# Patient Record
Sex: Female | Born: 1955 | Race: White | Hispanic: No | Smoking: Never smoker
Health system: Southern US, Community
[De-identification: ages and names within clinical notes are randomized; demographics above are authoritative.]

## PROBLEM LIST (undated history)

## (undated) DIAGNOSIS — J302 Other seasonal allergic rhinitis: Secondary | ICD-10-CM

## (undated) DIAGNOSIS — F1410 Cocaine abuse, uncomplicated: Secondary | ICD-10-CM

## (undated) DIAGNOSIS — T8859XA Other complications of anesthesia, initial encounter: Secondary | ICD-10-CM

## (undated) DIAGNOSIS — C569 Malignant neoplasm of unspecified ovary: Secondary | ICD-10-CM

## (undated) DIAGNOSIS — J3489 Other specified disorders of nose and nasal sinuses: Secondary | ICD-10-CM

## (undated) DIAGNOSIS — F319 Bipolar disorder, unspecified: Secondary | ICD-10-CM

## (undated) DIAGNOSIS — R569 Unspecified convulsions: Secondary | ICD-10-CM

## (undated) HISTORY — PX: OOPHORECTOMY: SHX86

## (undated) HISTORY — DX: Cocaine abuse, uncomplicated: F14.10

## (undated) HISTORY — DX: Bipolar disorder, unspecified: F31.9

## (undated) HISTORY — PX: OTHER SURGICAL HISTORY: SHX169

## (undated) HISTORY — PX: TOTAL ABDOMINAL HYSTERECTOMY: SHX209

## (undated) HISTORY — DX: Other specified disorders of nose and nasal sinuses: J34.89

## (undated) HISTORY — PX: APPENDECTOMY: SHX54

## (undated) HISTORY — PX: BREAST BIOPSY: SHX20

---

## 1998-02-04 ENCOUNTER — Inpatient Hospital Stay (HOSPITAL_COMMUNITY): Admission: RE | Admit: 1998-02-04 | Discharge: 1998-02-07 | Payer: Self-pay | Admitting: *Deleted

## 1998-04-02 ENCOUNTER — Ambulatory Visit (HOSPITAL_COMMUNITY): Admission: RE | Admit: 1998-04-02 | Discharge: 1998-04-02 | Payer: Self-pay | Admitting: *Deleted

## 1998-04-20 ENCOUNTER — Inpatient Hospital Stay (HOSPITAL_COMMUNITY): Admission: AD | Admit: 1998-04-20 | Discharge: 1998-04-23 | Payer: Self-pay | Admitting: Obstetrics and Gynecology

## 1998-09-01 ENCOUNTER — Emergency Department (HOSPITAL_COMMUNITY): Admission: EM | Admit: 1998-09-01 | Discharge: 1998-09-01 | Payer: Self-pay | Admitting: Emergency Medicine

## 1998-09-01 ENCOUNTER — Encounter: Payer: Self-pay | Admitting: Emergency Medicine

## 1998-09-17 ENCOUNTER — Ambulatory Visit (HOSPITAL_COMMUNITY): Admission: RE | Admit: 1998-09-17 | Discharge: 1998-09-17 | Payer: Self-pay | Admitting: *Deleted

## 1998-11-17 ENCOUNTER — Emergency Department (HOSPITAL_COMMUNITY): Admission: EM | Admit: 1998-11-17 | Discharge: 1998-11-17 | Payer: Self-pay | Admitting: Emergency Medicine

## 1998-11-17 ENCOUNTER — Encounter: Payer: Self-pay | Admitting: Emergency Medicine

## 1999-06-09 ENCOUNTER — Other Ambulatory Visit: Admission: RE | Admit: 1999-06-09 | Discharge: 1999-06-09 | Payer: Self-pay | Admitting: *Deleted

## 1999-11-17 ENCOUNTER — Encounter: Admission: RE | Admit: 1999-11-17 | Discharge: 1999-11-17 | Payer: Self-pay | Admitting: Family Medicine

## 1999-11-17 ENCOUNTER — Encounter: Payer: Self-pay | Admitting: Family Medicine

## 2000-07-12 ENCOUNTER — Other Ambulatory Visit: Admission: RE | Admit: 2000-07-12 | Discharge: 2000-07-12 | Payer: Self-pay | Admitting: *Deleted

## 2001-10-26 ENCOUNTER — Ambulatory Visit (HOSPITAL_COMMUNITY): Admission: RE | Admit: 2001-10-26 | Discharge: 2001-10-26 | Payer: Self-pay | Admitting: Family Medicine

## 2001-10-26 ENCOUNTER — Other Ambulatory Visit: Admission: RE | Admit: 2001-10-26 | Discharge: 2001-10-26 | Payer: Self-pay | Admitting: *Deleted

## 2001-10-26 ENCOUNTER — Encounter: Payer: Self-pay | Admitting: Family Medicine

## 2001-12-28 ENCOUNTER — Ambulatory Visit (HOSPITAL_BASED_OUTPATIENT_CLINIC_OR_DEPARTMENT_OTHER): Admission: RE | Admit: 2001-12-28 | Discharge: 2001-12-28 | Payer: Self-pay | Admitting: Otolaryngology

## 2001-12-28 ENCOUNTER — Encounter (INDEPENDENT_AMBULATORY_CARE_PROVIDER_SITE_OTHER): Payer: Self-pay | Admitting: Specialist

## 2002-04-30 ENCOUNTER — Ambulatory Visit (HOSPITAL_COMMUNITY): Admission: RE | Admit: 2002-04-30 | Discharge: 2002-04-30 | Payer: Self-pay | Admitting: *Deleted

## 2002-04-30 ENCOUNTER — Encounter: Payer: Self-pay | Admitting: *Deleted

## 2002-08-19 ENCOUNTER — Encounter: Admission: RE | Admit: 2002-08-19 | Discharge: 2002-08-19 | Payer: Self-pay | Admitting: Family Medicine

## 2002-08-19 ENCOUNTER — Encounter: Payer: Self-pay | Admitting: Family Medicine

## 2002-11-18 ENCOUNTER — Other Ambulatory Visit: Admission: RE | Admit: 2002-11-18 | Discharge: 2002-11-18 | Payer: Self-pay | Admitting: *Deleted

## 2002-12-16 ENCOUNTER — Encounter: Admission: RE | Admit: 2002-12-16 | Discharge: 2002-12-16 | Payer: Self-pay | Admitting: *Deleted

## 2002-12-16 ENCOUNTER — Encounter: Payer: Self-pay | Admitting: *Deleted

## 2002-12-17 ENCOUNTER — Encounter: Payer: Self-pay | Admitting: *Deleted

## 2002-12-17 ENCOUNTER — Encounter: Admission: RE | Admit: 2002-12-17 | Discharge: 2002-12-17 | Payer: Self-pay | Admitting: *Deleted

## 2002-12-17 ENCOUNTER — Encounter (INDEPENDENT_AMBULATORY_CARE_PROVIDER_SITE_OTHER): Payer: Self-pay | Admitting: *Deleted

## 2003-01-06 ENCOUNTER — Other Ambulatory Visit: Admission: RE | Admit: 2003-01-06 | Discharge: 2003-01-06 | Payer: Self-pay | Admitting: Family Medicine

## 2003-10-19 ENCOUNTER — Ambulatory Visit (HOSPITAL_COMMUNITY): Admission: RE | Admit: 2003-10-19 | Discharge: 2003-10-19 | Payer: Self-pay | Admitting: Unknown Physician Specialty

## 2003-11-13 ENCOUNTER — Encounter: Admission: RE | Admit: 2003-11-13 | Discharge: 2003-11-13 | Payer: Self-pay | Admitting: Family Medicine

## 2003-11-20 ENCOUNTER — Other Ambulatory Visit: Admission: RE | Admit: 2003-11-20 | Discharge: 2003-11-20 | Payer: Self-pay | Admitting: *Deleted

## 2005-01-11 ENCOUNTER — Encounter: Admission: RE | Admit: 2005-01-11 | Discharge: 2005-01-11 | Payer: Self-pay | Admitting: Internal Medicine

## 2005-05-11 ENCOUNTER — Emergency Department (HOSPITAL_COMMUNITY): Admission: EM | Admit: 2005-05-11 | Discharge: 2005-05-12 | Payer: Self-pay | Admitting: Emergency Medicine

## 2005-11-30 ENCOUNTER — Encounter: Payer: Self-pay | Admitting: Obstetrics and Gynecology

## 2006-06-11 ENCOUNTER — Emergency Department (HOSPITAL_COMMUNITY): Admission: EM | Admit: 2006-06-11 | Discharge: 2006-06-12 | Payer: Self-pay | Admitting: Emergency Medicine

## 2006-08-04 ENCOUNTER — Emergency Department (HOSPITAL_COMMUNITY): Admission: EM | Admit: 2006-08-04 | Discharge: 2006-08-04 | Payer: Self-pay | Admitting: Emergency Medicine

## 2007-08-13 ENCOUNTER — Emergency Department (HOSPITAL_COMMUNITY): Admission: EM | Admit: 2007-08-13 | Discharge: 2007-08-13 | Payer: Self-pay | Admitting: Family Medicine

## 2007-09-24 ENCOUNTER — Encounter (INDEPENDENT_AMBULATORY_CARE_PROVIDER_SITE_OTHER): Payer: Self-pay | Admitting: Urology

## 2007-09-24 ENCOUNTER — Ambulatory Visit (HOSPITAL_BASED_OUTPATIENT_CLINIC_OR_DEPARTMENT_OTHER): Admission: RE | Admit: 2007-09-24 | Discharge: 2007-09-24 | Payer: Self-pay | Admitting: Urology

## 2008-07-16 ENCOUNTER — Encounter: Admission: RE | Admit: 2008-07-16 | Discharge: 2008-07-16 | Payer: Self-pay | Admitting: Radiology

## 2008-09-23 ENCOUNTER — Emergency Department (HOSPITAL_COMMUNITY): Admission: EM | Admit: 2008-09-23 | Discharge: 2008-09-23 | Payer: Self-pay | Admitting: Emergency Medicine

## 2008-10-02 ENCOUNTER — Emergency Department (HOSPITAL_COMMUNITY): Admission: EM | Admit: 2008-10-02 | Discharge: 2008-10-02 | Payer: Self-pay | Admitting: Family Medicine

## 2009-04-07 ENCOUNTER — Emergency Department (HOSPITAL_COMMUNITY): Admission: EM | Admit: 2009-04-07 | Discharge: 2009-04-07 | Payer: Self-pay | Admitting: Emergency Medicine

## 2009-06-02 ENCOUNTER — Emergency Department (HOSPITAL_COMMUNITY): Admission: EM | Admit: 2009-06-02 | Discharge: 2009-06-02 | Payer: Self-pay | Admitting: Family Medicine

## 2009-06-17 ENCOUNTER — Emergency Department (HOSPITAL_COMMUNITY): Admission: EM | Admit: 2009-06-17 | Discharge: 2009-06-17 | Payer: Self-pay | Admitting: Family Medicine

## 2009-06-18 ENCOUNTER — Inpatient Hospital Stay (HOSPITAL_COMMUNITY): Admission: EM | Admit: 2009-06-18 | Discharge: 2009-06-19 | Payer: Self-pay | Admitting: Emergency Medicine

## 2009-06-18 ENCOUNTER — Encounter (INDEPENDENT_AMBULATORY_CARE_PROVIDER_SITE_OTHER): Payer: Self-pay

## 2010-06-21 ENCOUNTER — Emergency Department (HOSPITAL_COMMUNITY): Admission: EM | Admit: 2010-06-21 | Discharge: 2010-06-21 | Payer: Self-pay | Admitting: Emergency Medicine

## 2010-10-11 ENCOUNTER — Inpatient Hospital Stay (INDEPENDENT_AMBULATORY_CARE_PROVIDER_SITE_OTHER)
Admission: RE | Admit: 2010-10-11 | Discharge: 2010-10-11 | Disposition: A | Discharge status: Home / Self Care | Payer: Self-pay | Source: Ambulatory Visit | Attending: Family Medicine | Admitting: Family Medicine

## 2010-10-11 DIAGNOSIS — J111 Influenza due to unidentified influenza virus with other respiratory manifestations: Secondary | ICD-10-CM

## 2010-10-14 ENCOUNTER — Emergency Department (HOSPITAL_COMMUNITY): Payer: Self-pay

## 2010-10-14 ENCOUNTER — Emergency Department (HOSPITAL_COMMUNITY)
Admission: EM | Admit: 2010-10-14 | Discharge: 2010-10-14 | Disposition: A | Discharge status: Home / Self Care | Payer: Self-pay | Attending: Emergency Medicine | Admitting: Emergency Medicine

## 2010-10-14 DIAGNOSIS — R05 Cough: Secondary | ICD-10-CM | POA: Insufficient documentation

## 2010-10-14 DIAGNOSIS — R059 Cough, unspecified: Secondary | ICD-10-CM | POA: Insufficient documentation

## 2010-10-14 DIAGNOSIS — IMO0001 Reserved for inherently not codable concepts without codable children: Secondary | ICD-10-CM | POA: Insufficient documentation

## 2010-10-14 DIAGNOSIS — J40 Bronchitis, not specified as acute or chronic: Secondary | ICD-10-CM | POA: Insufficient documentation

## 2010-11-03 ENCOUNTER — Emergency Department (HOSPITAL_COMMUNITY)
Admission: EM | Admit: 2010-11-03 | Discharge: 2010-11-03 | Disposition: A | Discharge status: Home / Self Care | Payer: Self-pay | Attending: Emergency Medicine | Admitting: Emergency Medicine

## 2010-11-03 DIAGNOSIS — H1045 Other chronic allergic conjunctivitis: Secondary | ICD-10-CM | POA: Insufficient documentation

## 2010-11-03 DIAGNOSIS — J9801 Acute bronchospasm: Secondary | ICD-10-CM | POA: Insufficient documentation

## 2010-11-03 DIAGNOSIS — R0602 Shortness of breath: Secondary | ICD-10-CM | POA: Insufficient documentation

## 2010-11-03 DIAGNOSIS — R0989 Other specified symptoms and signs involving the circulatory and respiratory systems: Secondary | ICD-10-CM | POA: Insufficient documentation

## 2010-11-03 DIAGNOSIS — R0609 Other forms of dyspnea: Secondary | ICD-10-CM | POA: Insufficient documentation

## 2010-11-03 LAB — DIFFERENTIAL
Basophils Absolute: 0 10*3/uL (ref 0.0–0.1)
Basophils Absolute: 0.2 10*3/uL — ABNORMAL HIGH (ref 0.0–0.1)
Basophils Relative: 0 % (ref 0–1)
Eosinophils Absolute: 0 10*3/uL (ref 0.0–0.7)
Eosinophils Absolute: 0.2 10*3/uL (ref 0.0–0.7)
Eosinophils Relative: 0 % (ref 0–5)
Lymphocytes Relative: 3 % — ABNORMAL LOW (ref 12–46)

## 2010-11-03 LAB — CBC
HCT: 33.6 % — ABNORMAL LOW (ref 36.0–46.0)
Hemoglobin: 11.4 g/dL — ABNORMAL LOW (ref 12.0–15.0)
MCHC: 33.8 g/dL (ref 30.0–36.0)
MCV: 87.8 fL (ref 78.0–100.0)
MCV: 89.3 fL (ref 78.0–100.0)
Platelets: 313 10*3/uL (ref 150–400)
RBC: 3.76 MIL/uL — ABNORMAL LOW (ref 3.87–5.11)
RBC: 4.57 MIL/uL (ref 3.87–5.11)
RDW: 12.7 % (ref 11.5–15.5)
WBC: 8.5 10*3/uL (ref 4.0–10.5)

## 2010-11-03 LAB — POCT URINALYSIS DIP (DEVICE)
Bilirubin Urine: NEGATIVE
Glucose, UA: NEGATIVE mg/dL

## 2010-11-03 LAB — COMPREHENSIVE METABOLIC PANEL
ALT: 18 U/L (ref 0–35)
AST: 23 U/L (ref 0–37)
Albumin: 4.1 g/dL (ref 3.5–5.2)
BUN: 10 mg/dL (ref 6–23)
CO2: 26 mEq/L (ref 19–32)
Creatinine, Ser: 0.87 mg/dL (ref 0.4–1.2)
Sodium: 134 mEq/L — ABNORMAL LOW (ref 135–145)

## 2010-11-03 LAB — RAPID URINE DRUG SCREEN, HOSP PERFORMED
Cocaine: POSITIVE — AB
Tetrahydrocannabinol: NOT DETECTED

## 2010-11-03 LAB — LIPASE, BLOOD: Lipase: 18 U/L (ref 11–59)

## 2010-12-14 NOTE — Op Note (Signed)
NAMEMAGGI, HERSHKOWITZ              ACCOUNT NO.:  [id number]   MEDICAL RECORD NO.:  [id number]          PATIENT TYPE:  AMB   LOCATION:  [facility]                         FACILITY:  [facility]   PHYSICIAN:  Maretta Bees. Vonita Moss, M.D.DATE OF BIRTH:  04-Feb-1956   DATE OF PROCEDURE:  09/24/2007  DATE OF DISCHARGE:                               OPERATIVE REPORT   PREOPERATIVE DIAGNOSIS:  Rule out interstitial cystitis.   POSTPROCEDURE DIAGNOSIS:  Rule out interstitial cystitis.   PROCEDURE:  Cystoscopy, hydraulic overdistention of bladder and cold cup  bladder biopsy.   SURGEON:  Dr. Larey Dresser.   ANESTHESIA:  General.   INDICATIONS:  This 55 year old lady has had problems with frequency and  nocturia and painful bladder and also some right lower quadrant and back  pain.  She was seen in the office and pelvic pain and symptom score was  significantly elevated to 28 and I am suspicious about interstitial  cystitis as the diagnosis.  I talked to the patient and her husband  about cystoscopy, HOD and bladder biopsy and the risk of postop pain and  bleeding and small risk of bladder perforation.   PROCEDURE:  The patient was brought to the operating room, placed in  lithotomy position.  External genitalia were prepped and draped in the  usual fashion.  She was cystoscoped and the bladder was perfectly  normal.  She held 550 mL and that was only because I had to compress the  urethra because she voided around the cystoscope.  Then after emptying  the bladder and looking back in, she had widespread submucosal petechiae  and hemorrhage consistent with interstitial cystitis.  Cold cup bladder  biopsies were taken from the bladder base and fulgurated with the Bugbee  electrode.  She then taken to recovery room in good condition after  receiving a B&O suppository and 30 mg of Toradol IV for postop  discomfort.      Maretta Bees. Vonita Moss, M.D.  Electronically Signed     LJP/MEDQ  D:  09/24/2007   T:  09/24/2007  Job:  [id number]   cc:   Lenon Curt. Chilton Si, M.D.  Fax: [phone]

## 2010-12-17 NOTE — Op Note (Signed)
[place]. [facility]  Patient:    Mary Mccormick, Mary Mccormick Visit Number: [id number] MRN: [id number]          Service Type: DSU Location: [facility] Attending Physician:  Carlean Purl Dictated by:   Kristine Garbe Ezzard Standing, M.D. Proc. Date: 12/28/01 Admit Date:  12/28/2001   CC:         Clydie Braun L. Hal Hope, M.D.   Operative Report  PREOPERATIVE DIAGNOSIS: 1. Enlarged right neck node. 2. 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 3. Chronic right ear and right neck pain.  POSTOPERATIVE DIAGNOSIS: 1. Enlarged right neck node. 2. 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 3. Chronic right ear and right neck pain.  OPERATION PERFORMED:  Direct laryngoscopy, excision of right tonsil.  Nasal endoscopy with biopsy.  Excision of deep right high jugular neck node.  SURGEON:  Kristine Garbe. Ezzard Standing, M.D.  ANESTHESIA:  General endotracheal.  COMPLICATIONS:  None.  INDICATIONS FOR PROCEDURE:  The patient is a 55 year old female who has had chronic right neck, right ear pain now for the past three months.  On examination, she has a normal upper airway exam.  Exam of the nose reveals a large anterior septal perforation with a large amount of crusting.  She also has a 2 to 3 cm painful high right jugular neck node just below the angle of the mandible.  She is taken to the operating room at this time for endoscopy with nasal biopsy, direct laryngoscopy and excision of right tonsil along with excision of deep right neck node.  DESCRIPTION OF PROCEDURE:  After adequate general endotracheal anesthesia, the nose was examined first.  The patient had a large anterior septal perforation with a large amount of crusting around the edges of the perforation as well as more posteriorly with more inflammation of the right side versus the left. Mucosal biopsies were obtained from the right nasal cavity and sent for frozen section  as well as permanent section.  On frozen section there is no evidence of neoplasia or active vasculitis.  Changes more consistent with chronic inflammation.  Following this, direct laryngoscopy was performed.  The base of tongue, vocal cords, aryepiglottic folds, pyriform sinuses were all clear. The patient had average sized tonsils.  The right tonsil was excised for biopsy.  Hemostasis was obtained with the cautery.  Following completion of this, the right neck was prepped with Betadine solution and draped out with sterile towels.  The proposed incision site of the right neck node was marked out and injected with Xylocaine with epinephrine.  An incision was then made through the skin down through the latissimus muscle and a high jugular neck node was dissected out.  This was smooth and rubbery, measuring approximately 1.5 x 3 cm in size.  Hemostasis was obtained with 3-0 silk ligatures and cautery.  The defect was closed with 3-0 chromic sutures subcutaneously and a 5-0 nylon subcuticular stitch.  Steri-Strips were placed as was a dressing. Following excision of the neck node, the nose was re-examined.  Hemostasis was obtained with cotton pledgets soaked in Afrin and suction cautery.  After obtaining adequate hemostasis, the nose was filled with Bactroban ointment. DISPOSITION:  The patient is discharged home later this morning on Tylenol and Lortab elixir 15 cc q.4h. p.r.n. pain, Augmentin suspension, 600 mg b.i.d. for a week.  Will have her follow-up in my office in one week for recheck and review pathology. Dictated by:   Kristine Garbe Ezzard Standing, M.D. Attending Physician:  Carlean Purl DD:  12/28/01 TD:  12/29/01 Job: [id number]

## 2011-04-21 LAB — I-STAT 8, (EC8 V) (CONVERTED LAB)
Acid-base deficit: 1
BUN: 7
Chloride: 109
HCT: 37
Hemoglobin: 12.6
Operator id: 151321
Potassium: 4
TCO2: 23
pCO2, Ven: 31.7 — ABNORMAL LOW
pH, Ven: 7.451 — ABNORMAL HIGH

## 2011-04-21 LAB — CBC
HCT: 37
Hemoglobin: 12.4
MCHC: 33.5
MCV: 88.1
Platelets: 271
RDW: 12.6
WBC: 5.3

## 2011-04-21 LAB — POCT I-STAT CREATININE
Creatinine, Ser: 0.7
Operator id: 151321

## 2011-04-21 LAB — POCT URINALYSIS DIP (DEVICE)
Ketones, ur: NEGATIVE
Nitrite: NEGATIVE
Operator id: 235561
pH: 5.5

## 2011-04-21 LAB — DIFFERENTIAL
Eosinophils Absolute: 0.8 — ABNORMAL HIGH
Eosinophils Relative: 16 — ABNORMAL HIGH
Lymphocytes Relative: 35
Monocytes Absolute: 0.6
Monocytes Relative: 11

## 2011-04-22 LAB — POCT HEMOGLOBIN-HEMACUE
Hemoglobin: 12.6
Operator id: 114531

## 2011-09-20 ENCOUNTER — Emergency Department (INDEPENDENT_AMBULATORY_CARE_PROVIDER_SITE_OTHER)
Admission: EM | Admit: 2011-09-20 | Discharge: 2011-09-20 | Disposition: A | Discharge status: Home / Self Care | Payer: Self-pay | Source: Home / Self Care | Attending: Family Medicine | Admitting: Family Medicine

## 2011-09-20 ENCOUNTER — Encounter (HOSPITAL_COMMUNITY): Payer: Self-pay | Admitting: Emergency Medicine

## 2011-09-20 DIAGNOSIS — J329 Chronic sinusitis, unspecified: Secondary | ICD-10-CM

## 2011-09-20 DIAGNOSIS — J45901 Unspecified asthma with (acute) exacerbation: Secondary | ICD-10-CM

## 2011-09-20 MED ORDER — IBUPROFEN 600 MG PO TABS: 600.0000 mg | ORAL_TABLET | Freq: Three times a day (TID) | ORAL | Status: AC | PRN

## 2011-09-20 MED ORDER — ALBUTEROL SULFATE HFA 108 (90 BASE) MCG/ACT IN AERS: 1.0000 | INHALATION_SPRAY | Freq: Four times a day (QID) | RESPIRATORY_TRACT | Status: DC | PRN

## 2011-09-20 MED ORDER — CETIRIZINE-PSEUDOEPHEDRINE ER 5-120 MG PO TB12: 1.0000 | ORAL_TABLET | Freq: Every day | ORAL | Status: AC

## 2011-09-20 MED ORDER — PREDNISONE 20 MG PO TABS: ORAL_TABLET | ORAL | Status: AC

## 2011-09-20 MED ORDER — AMOXICILLIN 500 MG PO CAPS: 500.0000 mg | ORAL_CAPSULE | Freq: Three times a day (TID) | ORAL | Status: AC

## 2011-09-20 NOTE — Discharge Instructions (Signed)
  Is very important top keep well hydrated. Take the prescribed medications as instructed. Can take ibuprofen scheduled for the next 24-48 hours take with food and plenty of liquids as it can upset your stomach, can alternate with tylenol every 6 hours as needed for pain or fever. Use nasal saline spray at least 3 times a day. (simply saline is over the counter) Return if difficulty breathing or not keeping fluids down.

## 2011-09-20 NOTE — ED Provider Notes (Signed)
History     CSN: [id number]  Arrival date & time 09/20/11  1826   First MD Initiated Contact with Patient 09/20/11 1846      Chief Complaint  Patient presents with  . Cough    (Consider location/radiation/quality/duration/timing/severity/associated sxs/prior treatment) HPI Comments: 56 year old former smoker female. Here complaining of nasal congestion, sinus pressure, headache, cough nonproductive and body aches for one week. Denies shortness of breath or chest pain. she states she has asthma and has used albuterol inhaler in the past but ran out. Has heard wheezing during coughing spells. Subjective fever. States she took ibuprofen about 6 hours ago her temperature here is 99.27F. had nausea and diarrhea the first days of the week, that is now resolved.   History reviewed. No pertinent past medical history.  History reviewed. No pertinent past surgical history.  No family history on file.  History  Substance Use Topics  . Smoking status: Former Games developer  . Smokeless tobacco: Not on file  . Alcohol Use: 0.6 oz/week    1 Shots of liquor per week     occasional    OB History    Grav Para Term Preterm Abortions TAB SAB Ect Mult Living                  Review of Systems  Constitutional: Positive for fever. Negative for chills.  HENT: Positive for congestion, rhinorrhea and sinus pressure. Negative for ear pain, sore throat and neck pain.   Respiratory: Positive for cough and wheezing. Negative for chest tightness.   Cardiovascular: Negative for chest pain and leg swelling.  Gastrointestinal: Negative for nausea, vomiting, abdominal pain and diarrhea.  Musculoskeletal: Positive for myalgias.  Skin: Negative for rash.  Neurological: Positive for headaches.    Allergies  Tagamet  Home Medications   Current Outpatient Rx  Name Route Sig Dispense Refill  . ALBUTEROL SULFATE HFA 108 (90 BASE) MCG/ACT IN AERS Inhalation Inhale 1-2 puffs into the lungs every 6 (six) hours  as needed for wheezing. 1 Inhaler 0  . AMOXICILLIN 500 MG PO CAPS Oral Take 1 capsule (500 mg total) by mouth 3 (three) times daily. 21 capsule 0  . CETIRIZINE-PSEUDOEPHEDRINE ER 5-120 MG PO TB12 Oral Take 1 tablet by mouth daily. 30 tablet 0  . IBUPROFEN 600 MG PO TABS Oral Take 1 tablet (600 mg total) by mouth every 8 (eight) hours as needed for pain. 20 tablet 0  . PREDNISONE 20 MG PO TABS  2 tabs po daqily for 5 days 10 tablet no    BP 131/69  Pulse 80  Temp(Src) 99.3 F (37.4 C) (Oral)  Resp 18  SpO2 100%  Physical Exam  Nursing note and vitals reviewed. Constitutional: She is oriented to person, place, and time. She appears well-developed and well-nourished. No distress.  HENT:  Head: Normocephalic and atraumatic.       Nasal Congestion with erythema and swelling of nasal turbinates, yellow rhinorrhea. mild pharyngeal erythema no exudates. No uvula deviation. No trismus. TM's normal  Eyes: Conjunctivae are normal. Pupils are equal, round, and reactive to light. Right eye exhibits no discharge. Left eye exhibits no discharge.  Neck: Neck supple.  Cardiovascular: Normal heart sounds.   Pulmonary/Chest: Breath sounds normal. No respiratory distress. She has no wheezes. She has no rales. She exhibits no tenderness.       Expiratory rhonchi during coughing. No tachypnea or orthopnea.   Lymphadenopathy:    She has no cervical adenopathy.  Neurological: She is alert  and oriented to person, place, and time.  Skin: No rash noted.    ED Course  Procedures (including critical care time)  Labs Reviewed - No data to display No results found.   1. Sinusitis   2. Asthma exacerbation       MDM  Treated with amoxicillin albuterol prednisone, ibuprofen and Zyrtec-D. asked her return if worsening symptoms despite following treatment.  Sharin Grave, MD 09/21/11 1012

## 2011-09-20 NOTE — ED Notes (Signed)
Pt. Stated, I started off having diarrhea, and then it was cold, cough, congestion, aand just pain all over. For a week

## 2011-11-19 ENCOUNTER — Emergency Department (INDEPENDENT_AMBULATORY_CARE_PROVIDER_SITE_OTHER)
Admission: EM | Admit: 2011-11-19 | Discharge: 2011-11-19 | Disposition: A | Discharge status: Home / Self Care | Payer: Self-pay | Source: Home / Self Care | Attending: Emergency Medicine | Admitting: Emergency Medicine

## 2011-11-19 ENCOUNTER — Encounter (HOSPITAL_COMMUNITY): Payer: Self-pay | Admitting: *Deleted

## 2011-11-19 DIAGNOSIS — R290 Tetany: Secondary | ICD-10-CM

## 2011-11-19 DIAGNOSIS — L259 Unspecified contact dermatitis, unspecified cause: Secondary | ICD-10-CM

## 2011-11-19 HISTORY — DX: Unspecified convulsions: R56.9

## 2011-11-19 HISTORY — DX: Other seasonal allergic rhinitis: J30.2

## 2011-11-19 LAB — POCT I-STAT, CHEM 8
Creatinine, Ser: 1.1 mg/dL (ref 0.50–1.10)
HCT: 43 % (ref 36.0–46.0)
Hemoglobin: 14.6 g/dL (ref 12.0–15.0)
Potassium: 4.3 mEq/L (ref 3.5–5.1)
Sodium: 141 mEq/L (ref 135–145)
TCO2: 26 mmol/L (ref 0–100)

## 2011-11-19 MED ORDER — METHYLPREDNISOLONE ACETATE 80 MG/ML IJ SUSP
INTRAMUSCULAR | Status: AC
  Filled 2011-11-19: qty 1

## 2011-11-19 MED ORDER — TRIAMCINOLONE ACETONIDE 0.1 % EX CREA: TOPICAL_CREAM | Freq: Three times a day (TID) | CUTANEOUS | Status: DC

## 2011-11-19 MED ORDER — METHYLPREDNISOLONE ACETATE 80 MG/ML IJ SUSP
80.0000 mg | Freq: Once | INTRAMUSCULAR | Status: AC
  Administered 2011-11-19: 80 mg via INTRAMUSCULAR

## 2011-11-19 MED ORDER — PREDNISONE 10 MG PO TABS: ORAL_TABLET | ORAL | Status: DC

## 2011-11-19 NOTE — ED Notes (Signed)
Rash right lower leg /bilateral arms - painful onset x one month - started on lower right leg then moved to arms and posterior neck at hairline

## 2011-11-19 NOTE — Discharge Instructions (Signed)

## 2011-11-19 NOTE — ED Provider Notes (Signed)
Chief Complaint  Patient presents with  . Rash    History of Present Illness:   The patient is a 56 year old female who has a one-month history of a rash on her arms, legs, neck, and scalp. The rash itches and hurts. Sometimes it feels like is "on fire". She recalls having had this before years ago. She saw a dermatologist who did a biopsy and told her was just a nonspecific dermatitis. She recalls that steroid creams did seem to help. She cannot think of any specific contactants. No change in soaps, detergents, washing powders, dryer sheets, or fabric softeners. She is not exposed to any network that might be breaking her out or any kind of home cleaning products or cosmetics. She is not taking any new medications or any suspicious foods.  Review of Systems:  Other than noted above, the patient denies any of the following symptoms: Systemic:  No fever, chills, sweats, weight loss, or fatigue. ENT:  No nasal congestion, rhinorrhea, sore throat, swelling of lips, tongue or throat. Resp:  No cough, wheezing, or shortness of breath. Skin:  No rash, itching, nodules, or suspicious lesions.  PMFSH:  Past medical history, family history, social history, meds, and allergies were reviewed.  Physical Exam:   Vital signs:  BP 117/71  Pulse 69  Temp(Src) 97.2 F (36.2 C) (Oral)  Resp 17  SpO2 95% Gen:  Alert, oriented, in no distress. Skin:  She has a nonspecific, erythematous, excoriated dermatitis on her left upper arm, left forearm, right elbow over the olecranon process, right lower leg, neck, and occipital scalp.  Course in Urgent Care Center:   She was given Depo-Medrol 80 mg IM and tolerated this well without any immediate side effects or complications.  Assessment:  The primary encounter diagnosis was Contact dermatitis. 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  Plan:   1.  The following meds were prescribed:   New Prescriptions   PREDNISONE (DELTASONE) 10 MG  TABLET    Take 4 tabs daily for 4 days, 3 tabs daily for 4 days, 2 tabs daily for 4 days, then 1 tab daily for 4 days.   TRIAMCINOLONE CREAM (KENALOG) 0.1 %    Apply topically 3 (three) times daily.   2.  The patient was instructed in symptomatic care and handouts were given. 3.  The patient was told to return if becoming worse in any way, if no better in 3 or 4 days, and given some red flag symptoms that would indicate earlier return.  Follow up:  The patient was told to follow up with [facility] if no better in 2 weeks.      Reuben Likes, MD 11/19/11 [phone]

## 2012-05-31 ENCOUNTER — Emergency Department (HOSPITAL_COMMUNITY)
Admission: EM | Admit: 2012-05-31 | Discharge: 2012-05-31 | Disposition: A | Discharge status: Home / Self Care | Payer: Self-pay | Attending: Emergency Medicine | Admitting: Emergency Medicine

## 2012-05-31 ENCOUNTER — Encounter (HOSPITAL_COMMUNITY): Payer: Self-pay | Admitting: Unknown Physician Specialty

## 2012-05-31 ENCOUNTER — Emergency Department (INDEPENDENT_AMBULATORY_CARE_PROVIDER_SITE_OTHER)
Admission: EM | Admit: 2012-05-31 | Discharge: 2012-05-31 | Disposition: A | Discharge status: Nursing Home (Includes ICF) | Payer: Self-pay | Source: Home / Self Care | Attending: Emergency Medicine | Admitting: Emergency Medicine

## 2012-05-31 ENCOUNTER — Encounter (HOSPITAL_COMMUNITY): Payer: Self-pay | Admitting: Emergency Medicine

## 2012-05-31 ENCOUNTER — Emergency Department (HOSPITAL_COMMUNITY): Payer: Self-pay

## 2012-05-31 DIAGNOSIS — J309 Allergic rhinitis, unspecified: Secondary | ICD-10-CM | POA: Insufficient documentation

## 2012-05-31 DIAGNOSIS — J40 Bronchitis, not specified as acute or chronic: Secondary | ICD-10-CM | POA: Insufficient documentation

## 2012-05-31 DIAGNOSIS — R0602 Shortness of breath: Secondary | ICD-10-CM | POA: Insufficient documentation

## 2012-05-31 DIAGNOSIS — J329 Chronic sinusitis, unspecified: Secondary | ICD-10-CM | POA: Insufficient documentation

## 2012-05-31 DIAGNOSIS — R079 Chest pain, unspecified: Secondary | ICD-10-CM

## 2012-05-31 DIAGNOSIS — Z79899 Other long term (current) drug therapy: Secondary | ICD-10-CM | POA: Insufficient documentation

## 2012-05-31 DIAGNOSIS — R569 Unspecified convulsions: Secondary | ICD-10-CM | POA: Insufficient documentation

## 2012-05-31 DIAGNOSIS — J45909 Unspecified asthma, uncomplicated: Secondary | ICD-10-CM | POA: Insufficient documentation

## 2012-05-31 MED ORDER — ASPIRIN 81 MG PO CHEW
324.0000 mg | CHEWABLE_TABLET | Freq: Once | ORAL | Status: AC
  Administered 2012-05-31: 324 mg via ORAL

## 2012-05-31 MED ORDER — NITROGLYCERIN 0.4 MG SL SUBL
0.4000 mg | SUBLINGUAL_TABLET | SUBLINGUAL | Status: AC | PRN
  Administered 2012-05-31: 0.4 mg via SUBLINGUAL

## 2012-05-31 MED ORDER — ALBUTEROL SULFATE (5 MG/ML) 0.5% IN NEBU
2.5000 mg | INHALATION_SOLUTION | Freq: Once | RESPIRATORY_TRACT | Status: AC
  Administered 2012-05-31: 2.5 mg via RESPIRATORY_TRACT
  Filled 2012-05-31: qty 0.5

## 2012-05-31 MED ORDER — AMOXICILLIN 500 MG PO CAPS: 500.0000 mg | ORAL_CAPSULE | Freq: Three times a day (TID) | ORAL | Status: DC

## 2012-05-31 MED ORDER — ALBUTEROL SULFATE HFA 108 (90 BASE) MCG/ACT IN AERS: 2.0000 | INHALATION_SPRAY | RESPIRATORY_TRACT | Status: DC | PRN

## 2012-05-31 MED ORDER — SODIUM CHLORIDE 0.9 % IV SOLN
INTRAVENOUS | Status: DC
Start: 2012-05-31 — End: 2012-05-31
  Administered 2012-05-31: 17:00:00 via INTRAVENOUS

## 2012-05-31 MED ORDER — PREDNISONE 20 MG PO TABS: 40.0000 mg | ORAL_TABLET | Freq: Every day | ORAL | Status: DC

## 2012-05-31 MED ORDER — NITROGLYCERIN 0.4 MG SL SUBL
SUBLINGUAL_TABLET | SUBLINGUAL | Status: AC
  Filled 2012-05-31: qty 25

## 2012-05-31 MED ORDER — ASPIRIN 81 MG PO CHEW
CHEWABLE_TABLET | ORAL | Status: AC
  Filled 2012-05-31: qty 4

## 2012-05-31 NOTE — ED Notes (Signed)
Reports intermittent episodes of bilateral arm pain onset last week, reports both hands "draw".

## 2012-05-31 NOTE — ED Notes (Signed)
Notified [facility] 

## 2012-05-31 NOTE — ED Provider Notes (Signed)
History     CSN: [id number]  Arrival date & time 05/31/12  1730   First MD Initiated Contact with Patient 05/31/12 1814      Chief Complaint  Patient presents with  . Chest Pain  . Shortness of Breath    (Consider location/radiation/quality/duration/timing/severity/associated sxs/prior treatment) HPI Comments: This is a 56 year old female, who presents to the emergency department with the chief complaint of cough and chest pain x3 weeks. The patient states that the cough and chest pain have not been improving. She has a family history of heart disease on her father's side, but denies diabetes, hypertension, hyperlipidemia, history of smoking, history of pulmonary embolism or blood clot, recent travel, or recent surgery. 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 She does not use a mask. She states that the pain in her chest is intermittent. She has tried using Mucinex with some relief.  The history is provided by the patient. No language interpreter was used.    Past Medical History  Diagnosis Date  . Asthma   . Seasonal allergies   . Seizure     Past Surgical History  Procedure Date  . Total abdominal hysterectomy w/ bilateral salpingoophorectomy   . Appendectomy     History reviewed. No pertinent family history.  History  Substance Use Topics  . Smoking status: Never Smoker   . Smokeless tobacco: Not on file  . Alcohol Use: 0.6 oz/week    1 Shots of liquor per week     occasional    OB History    Grav Para Term Preterm Abortions TAB SAB Ect Mult Living                  Review of Systems  Constitutional: Negative for fever.  Respiratory: Positive for cough, shortness of breath and wheezing.   Cardiovascular: Positive for chest pain. Negative for palpitations and leg swelling.  All other systems reviewed and are negative.    Allergies  Codeine and Tagamet  Home Medications   Current Outpatient Rx  Name Route Sig  Dispense Refill  . CETIRIZINE-PSEUDOEPHEDRINE ER 5-120 MG PO TB12 Oral Take 1 tablet by mouth daily. 30 tablet 0  . ALBUTEROL SULFATE HFA 108 (90 BASE) MCG/ACT IN AERS Inhalation Inhale 1-2 puffs into the lungs every 6 (six) hours as needed for wheezing. 1 Inhaler 0    BP 138/71  Pulse 63  Resp 14  SpO2 100%  Physical Exam  Nursing note and vitals reviewed. Constitutional: She is oriented to person, place, and time. She appears well-developed and well-nourished.  HENT:  Head: Normocephalic and atraumatic.  Eyes: Conjunctivae normal and EOM are normal. Pupils are equal, round, and reactive to light.  Neck: Normal range of motion. Neck supple.  Cardiovascular: Normal rate, regular rhythm and normal heart sounds.  Exam reveals no gallop and no friction rub.   No murmur heard. Pulmonary/Chest: Effort normal. No respiratory distress. She has wheezes. She has no rales. She exhibits no tenderness.  Abdominal: Soft. Bowel sounds are normal. She exhibits no distension and no mass. There is no tenderness. There is no rebound and no guarding.  Musculoskeletal: Normal range of motion.  Neurological: She is alert and oriented to person, place, and time.  Skin: Skin is warm and dry.  Psychiatric: She has a normal mood and affect. Her behavior is normal. Judgment and thought content normal.    ED Course  Procedures (including critical care time)  Labs  Reviewed - No data to display Dg Chest 2 View  05/31/2012  *RADIOLOGY REPORT*  Clinical Data: Shortness of breath.  Chest pain and cough.  CHEST - 2 VIEW  Comparison: Two-view chest 10/14/1998 at 12.  Findings: Heart size is normal.  Perihilar bronchitic changes appear chronic.  There may be some superimposed acute bronchitic change.  No airspace consolidation is evident.  The visualized soft tissues and bony thorax are unremarkable.  IMPRESSION:  1.  Bilateral perihilar bronchitic change.  There may be an acute component. 2.  No airspace  consolidation.   Original Report Authenticated By: Marin Roberts, M.D.    ED ECG REPORT  I personally interpreted this EKG   Date: 05/31/2012   Rate: 60  Rhythm: normal sinus rhythm  QRS Axis: normal  Intervals: normal  ST/T Wave abnormalities: nonspecific ST changes and nonspecific ST/T changes  Conduction Disutrbances:none  Narrative Interpretation:   Old EKG Reviewed: none available    1. Bronchitis   2. Sinusitis       MDM  56 year old female with chest pain and cough. I suspect that this is likely a bronchitis. This patient has been seen by and discussed with Dr. Bebe Shaggy.  I have also reviewed the note from [facility] tonight.  I have discussed with the patient that we are not able to completely rule out cardiac etiology with just the chest xray and the EKG.  She acknowledges this, and states that she is aware of the risks.  Return precautions have been given.  I am going to discharge the patient to home with instructions to follow-up with her PCP.  Treatments:  1. Amoxicillin 2. MDI 3. Prednisone         [name], PA-C 05/31/12 2002

## 2012-05-31 NOTE — ED Notes (Signed)
Mary Mccormick for 3 weeks: difficulty breathing, congested nose, reports low grade fever.  Left chest pain started last week, now pain is across chest, feels like someone is "squishing me together".  Denies nausea, denies vomiting.  Gets increase sob with activity.

## 2012-05-31 NOTE — ED Provider Notes (Signed)
Chief Complaint  Patient presents with  . Chest Pain    History of Present Illness:   The patient is a 56 year old female who presents with a three-week history of chest pain and tightness. The patient states that this began with some nasal congestion and drainage. She also had a slight cough and a slight sore throat. She denies any fever or chills. The chest tightness has been going on for about 2 weeks. It began on the left then moved to the right. It's a constant pressure or pain associated with some neck stiffness. Nothing particularly makes it better or worse. It's not worse with position, exertion, activity, meals. It is associated with some shortness of breath, nausea, sweats, palpitations, and dizziness. The patient also states she's felt tired and rundown, feverish, had chills, excessive gas. She also relates a separate history of aching in all the joints of both of her hands shocklike pain radiating to her hands and drawing of her hands for the past 3 weeks. She denies any history of arthritis or rheumatoid arthritis in the past. She has no risk factors including no hypertension, diabetes, elevated cholesterol, or history of cigarette smoking. Her father had a coronary artery bypass graft but this was in the 66s. She has no cardiac history, and otherwise is in good health. She is allergic to codeine and Tagamet.  Review of Systems:  Other than noted above, the patient denies any of the following symptoms. Systemic:  No fever, chills, sweats, or fatigue. ENT:  No nasal congestion, rhinorrhea, or sore throat. Pulmonary:  No cough, wheezing, shortness of breath, sputum production, hemoptysis. Cardiac:  No palpitations, rapid heartbeat, dizziness, presyncope or syncope. GI:  No abdominal pain, heartburn, nausea, or vomiting. Ext:  No leg pain or swelling.  PMFSH:  Past medical history, family history, social history, meds, and allergies were reviewed and updated as needed.   Physical Exam:     Vital signs:  BP 132/64  Pulse 67  Temp 97.9 F (36.6 C) (Oral)  Resp 12  SpO2 99% Gen:  Alert, oriented, in no distress, skin warm and dry. Eye:  PERRL, lids and conjunctivas normal.  Sclera non-icteric. ENT:  Mucous membranes moist, pharynx clear. Neck:  Supple, no adenopathy or tenderness.  No JVD. Lungs:  Clear to auscultation, no wheezes, rales or rhonchi.  No respiratory distress. Heart:  Regular rhythm.  No gallops, murmers, clicks or rubs. Chest:  No chest wall tenderness. Abdomen:  Soft, nontender, no organomegaly or mass.  Bowel sounds normal.  No pulsatile abdominal mass or bruit. Ext:  No edema.  No calf tenderness and Homann's sign negative.  Pulses full and equal. Skin:  Warm and dry.  No rash.  EKG:   Date: 05/31/2012  Rate: 60  Rhythm: normal sinus rhythm  QRS Axis: normal  Intervals: normal  ST/T Wave abnormalities: normal  Conduction Disutrbances:none  Narrative Interpretation: Normal sinus rhythm, possible left atrial enlargement, otherwise normal.  Old EKG Reviewed: none available  Medications given in [facility]:  She was given IV normal saline at 50 mL per hour, nitroglycerin 0.4 mg sublingually, and aspirin 325 mg by mouth.  Assessment:  The encounter diagnosis was Chest pain.  Plan:   1.  The following meds were prescribed:   New Prescriptions   No medications on file   2.  The patient was instructed in symptomatic care and handouts were given. 3.  The patient was transferred to the emergency department via [facility] in stable condition.  Reuben Likes, MD 05/31/12 [phone]

## 2012-05-31 NOTE — ED Notes (Signed)
Pt plac

## 2012-05-31 NOTE — ED Notes (Signed)
Placed pt on 2lpm 02.monitor @ 16:30

## 2012-05-31 NOTE — ED Notes (Signed)
Pt arrived via [facility] from [facility] with chest pain x3 wks. Patient states pain never goes completely away but fluctuates in intensity, 4/10 chest pressure at this time. Patient states she had neck pain but not at this time.

## 2012-06-01 NOTE — ED Provider Notes (Signed)
Medical screening examination/treatment/procedure(s) were conducted as a shared visit with non-physician practitioner(s) and myself.  I personally evaluated the patient during the encounter  Pt refused further workup except CXR/EKG.  She understood risks of leaving and encouraged to return if her symptoms worsened  Mary Gaskins, MD 06/01/12 1506

## 2013-06-21 ENCOUNTER — Emergency Department (INDEPENDENT_AMBULATORY_CARE_PROVIDER_SITE_OTHER)
Admission: EM | Admit: 2013-06-21 | Discharge: 2013-06-21 | Disposition: A | Discharge status: Home / Self Care | Payer: Self-pay | Source: Home / Self Care

## 2013-06-21 ENCOUNTER — Encounter (HOSPITAL_COMMUNITY): Payer: Self-pay | Admitting: Emergency Medicine

## 2013-06-21 DIAGNOSIS — J45909 Unspecified asthma, uncomplicated: Secondary | ICD-10-CM

## 2013-06-21 DIAGNOSIS — J329 Chronic sinusitis, unspecified: Secondary | ICD-10-CM

## 2013-06-21 DIAGNOSIS — B349 Viral infection, unspecified: Secondary | ICD-10-CM

## 2013-06-21 DIAGNOSIS — J309 Allergic rhinitis, unspecified: Secondary | ICD-10-CM

## 2013-06-21 DIAGNOSIS — F411 Generalized anxiety disorder: Secondary | ICD-10-CM

## 2013-06-21 DIAGNOSIS — R51 Headache: Secondary | ICD-10-CM

## 2013-06-21 DIAGNOSIS — R4589 Other symptoms and signs involving emotional state: Secondary | ICD-10-CM

## 2013-06-21 MED ORDER — ONDANSETRON HCL 4 MG/2ML IJ SOLN
INTRAMUSCULAR | Status: AC
  Filled 2013-06-21: qty 2

## 2013-06-21 MED ORDER — SODIUM CHLORIDE 0.9 % IV SOLN
Freq: Once | INTRAVENOUS | Status: AC
  Administered 2013-06-21: 15:00:00 via INTRAVENOUS

## 2013-06-21 MED ORDER — DIPHENOXYLATE-ATROPINE 2.5-0.025 MG/5ML PO LIQD: 10.0000 mL | Freq: Four times a day (QID) | ORAL | Status: DC | PRN

## 2013-06-21 MED ORDER — ONDANSETRON HCL 4 MG/2ML IJ SOLN
4.0000 mg | Freq: Once | INTRAMUSCULAR | Status: AC
  Administered 2013-06-21: 4 mg via INTRAVENOUS

## 2013-06-21 NOTE — ED Provider Notes (Signed)
CSN: [id number]     Arrival date & time 06/21/13  1244 History   First MD Initiated Contact with Patient 06/21/13 1414     Chief Complaint  Patient presents with  . URI    cough. congestion. fever. since sunday   (Consider location/radiation/quality/duration/timing/severity/associated sxs/prior Treatment) Patient is a 57 y.o. female presenting with URI. The history is provided by the patient.  URI Presenting symptoms: congestion, cough, ear pain, fever and rhinorrhea   Severity:  Mild Onset quality:  Gradual Duration:  4 days Progression:  Worsening Chronicity:  New Associated symptoms comment:  Diarrhea today, no blood seen., sl nausea, no vomiting.   Past Medical History  Diagnosis Date  . Asthma   . Seasonal allergies   . Seizure    Past Surgical History  Procedure Laterality Date  . Total abdominal hysterectomy w/ bilateral salpingoophorectomy    . Appendectomy     History reviewed. No pertinent family history. History  Substance Use Topics  . Smoking status: Never Smoker   . Smokeless tobacco: Not on file  . Alcohol Use: 0.6 oz/week    1 Shots of liquor per week     Comment: occasional   OB History   Grav Para Term Preterm Abortions TAB SAB Ect Mult Living                 Review of Systems  Constitutional: Positive for fever.  HENT: Positive for congestion, ear pain and rhinorrhea.   Respiratory: Positive for cough.   Cardiovascular: Negative.   Gastrointestinal: Negative.   Genitourinary: Negative.     Allergies  Codeine and Tagamet  Home Medications   Current Outpatient Rx  Name  Route  Sig  Dispense  Refill  . EXPIRED: albuterol (PROVENTIL HFA;VENTOLIN HFA) 108 (90 BASE) MCG/ACT inhaler   Inhalation   Inhale 1-2 puffs into the lungs every 6 (six) hours as needed for wheezing.   1 Inhaler   0   . albuterol (PROVENTIL HFA;VENTOLIN HFA) 108 (90 BASE) MCG/ACT inhaler   Inhalation   Inhale 2 puffs into the lungs every 4 (four) hours as needed  for wheezing or shortness of breath.   1 Inhaler   3   . amoxicillin (AMOXIL) 500 MG capsule   Oral   Take 1 capsule (500 mg total) by mouth 3 (three) times daily.   30 capsule   0   . diphenoxylate-atropine (LOMOTIL) 2.5-0.025 MG/5ML liquid   Oral   Take 10 mLs by mouth 4 (four) times daily as needed for diarrhea or loose stools.   120 mL   0   . predniSONE (DELTASONE) 20 MG tablet   Oral   Take 2 tablets (40 mg total) by mouth daily.   10 tablet   0    BP 115/74  Pulse 80  Temp(Src) 99 F (37.2 C) (Oral)  Resp 16  SpO2 99% Physical Exam  Nursing note and vitals reviewed. Constitutional: She appears well-developed and well-nourished.  Neck: Normal range of motion. Neck supple.  Cardiovascular: Regular rhythm and normal heart sounds.   Pulmonary/Chest: Effort normal and breath sounds normal.  Abdominal: Soft. Bowel sounds are normal. She exhibits no distension and no mass. There is no tenderness. There is no rebound and no guarding.  Skin: Skin is warm and dry.    ED Course  Procedures (including critical care time) Labs Review Labs Reviewed - No data to display Imaging Review No results found.  EKG Interpretation    Date/Time:  Ventricular Rate:    PR Interval:    QRS Duration:   QT Interval:    QTC Calculation:   R Axis:     Text Interpretation:              MDM  Sx improved after ivf.    Linna Hoff, MD 06/21/13 [phone]

## 2013-06-21 NOTE — ED Notes (Signed)
C/o nonproductive cough. Chest congestion with sob. Fever. And diarrhea that started today.  Pt has tried day quill with no relief in symptoms.  Hx of asthma

## 2013-09-17 ENCOUNTER — Encounter (HOSPITAL_COMMUNITY): Payer: Self-pay | Admitting: Emergency Medicine

## 2013-09-17 ENCOUNTER — Emergency Department (INDEPENDENT_AMBULATORY_CARE_PROVIDER_SITE_OTHER)
Admission: EM | Admit: 2013-09-17 | Discharge: 2013-09-17 | Disposition: A | Discharge status: Home / Self Care | Payer: Self-pay | Source: Home / Self Care | Attending: Emergency Medicine | Admitting: Emergency Medicine

## 2013-09-17 ENCOUNTER — Emergency Department (HOSPITAL_COMMUNITY)
Admission: EM | Admit: 2013-09-17 | Discharge: 2013-09-18 | Disposition: A | Discharge status: Home / Self Care | Payer: Self-pay | Attending: Emergency Medicine | Admitting: Emergency Medicine

## 2013-09-17 DIAGNOSIS — J309 Allergic rhinitis, unspecified: Secondary | ICD-10-CM | POA: Insufficient documentation

## 2013-09-17 DIAGNOSIS — R110 Nausea: Secondary | ICD-10-CM | POA: Insufficient documentation

## 2013-09-17 DIAGNOSIS — Z79899 Other long term (current) drug therapy: Secondary | ICD-10-CM | POA: Insufficient documentation

## 2013-09-17 DIAGNOSIS — R569 Unspecified convulsions: Secondary | ICD-10-CM | POA: Insufficient documentation

## 2013-09-17 DIAGNOSIS — R197 Diarrhea, unspecified: Secondary | ICD-10-CM

## 2013-09-17 DIAGNOSIS — R509 Fever, unspecified: Secondary | ICD-10-CM | POA: Insufficient documentation

## 2013-09-17 DIAGNOSIS — Z885 Allergy status to narcotic agent status: Secondary | ICD-10-CM | POA: Insufficient documentation

## 2013-09-17 DIAGNOSIS — J45909 Unspecified asthma, uncomplicated: Secondary | ICD-10-CM | POA: Insufficient documentation

## 2013-09-17 DIAGNOSIS — Z888 Allergy status to other drugs, medicaments and biological substances status: Secondary | ICD-10-CM | POA: Insufficient documentation

## 2013-09-17 DIAGNOSIS — K5792 Diverticulitis of intestine, part unspecified, without perforation or abscess without bleeding: Secondary | ICD-10-CM

## 2013-09-17 DIAGNOSIS — R1032 Left lower quadrant pain: Secondary | ICD-10-CM

## 2013-09-17 DIAGNOSIS — K5732 Diverticulitis of large intestine without perforation or abscess without bleeding: Secondary | ICD-10-CM

## 2013-09-17 LAB — URINALYSIS, ROUTINE W REFLEX MICROSCOPIC
Bilirubin Urine: NEGATIVE
Glucose, UA: NEGATIVE mg/dL
Ketones, ur: NEGATIVE mg/dL
Nitrite: NEGATIVE
PROTEIN: NEGATIVE mg/dL
Specific Gravity, Urine: 1.016 (ref 1.005–1.030)
Urobilinogen, UA: 0.2 mg/dL (ref 0.0–1.0)
pH: 5.5 (ref 5.0–8.0)

## 2013-09-17 LAB — CBC WITH DIFFERENTIAL/PLATELET
Basophils Absolute: 0 10*3/uL (ref 0.0–0.1)
Basophils Relative: 0 % (ref 0–1)
Eosinophils Absolute: 0.4 10*3/uL (ref 0.0–0.7)
Eosinophils Relative: 7 % — ABNORMAL HIGH (ref 0–5)
HCT: 39.4 % (ref 36.0–46.0)
Hemoglobin: 13.5 g/dL (ref 12.0–15.0)
LYMPHS PCT: 36 % (ref 12–46)
Lymphs Abs: 1.9 10*3/uL (ref 0.7–4.0)
MCH: 29.2 pg (ref 26.0–34.0)
MCHC: 34.3 g/dL (ref 30.0–36.0)
MCV: 85.3 fL (ref 78.0–100.0)
MONO ABS: 0.5 10*3/uL (ref 0.1–1.0)
Monocytes Relative: 10 % (ref 3–12)
NEUTROS ABS: 2.6 10*3/uL (ref 1.7–7.7)
Neutrophils Relative %: 48 % (ref 43–77)
Platelets: 276 10*3/uL (ref 150–400)
RBC: 4.62 MIL/uL (ref 3.87–5.11)
RDW: 12.7 % (ref 11.5–15.5)
WBC: 5.4 10*3/uL (ref 4.0–10.5)

## 2013-09-17 LAB — COMPREHENSIVE METABOLIC PANEL
ALT: 15 U/L (ref 0–35)
AST: 18 U/L (ref 0–37)
Albumin: 3.9 g/dL (ref 3.5–5.2)
Alkaline Phosphatase: 77 U/L (ref 39–117)
BILIRUBIN TOTAL: 0.4 mg/dL (ref 0.3–1.2)
BUN: 9 mg/dL (ref 6–23)
CHLORIDE: 102 meq/L (ref 96–112)
CO2: 24 meq/L (ref 19–32)
CREATININE: 0.89 mg/dL (ref 0.50–1.10)
Calcium: 9.3 mg/dL (ref 8.4–10.5)
GFR calc Af Amer: 82 mL/min — ABNORMAL LOW (ref >90)
GFR, EST NON AFRICAN AMERICAN: 71 mL/min — AB (ref >90)
Glucose, Bld: 104 mg/dL — ABNORMAL HIGH (ref 70–99)
Potassium: 3.8 mEq/L (ref 3.7–5.3)
SODIUM: 140 meq/L (ref 137–147)
Total Protein: 7 g/dL (ref 6.0–8.3)

## 2013-09-17 LAB — URINE MICROSCOPIC-ADD ON

## 2013-09-17 MED ORDER — MORPHINE SULFATE 4 MG/ML IJ SOLN: 4.0000 mg | Freq: Once | INTRAMUSCULAR | Status: DC

## 2013-09-17 MED ORDER — ONDANSETRON HCL 4 MG/2ML IJ SOLN: 4.0000 mg | Freq: Once | INTRAMUSCULAR | Status: DC

## 2013-09-17 MED ORDER — ONDANSETRON 4 MG PO TBDP
4.0000 mg | ORAL_TABLET | Freq: Once | ORAL | Status: AC
  Administered 2013-09-17: 4 mg via ORAL

## 2013-09-17 MED ORDER — SODIUM CHLORIDE 0.9 % IV BOLUS (SEPSIS)
1000.0000 mL | Freq: Once | INTRAVENOUS | Status: AC
  Administered 2013-09-17: 1000 mL via INTRAVENOUS

## 2013-09-17 MED ORDER — ONDANSETRON 4 MG PO TBDP
ORAL_TABLET | ORAL | Status: AC
  Filled 2013-09-17: qty 1

## 2013-09-17 MED ORDER — HYDROMORPHONE HCL PF 1 MG/ML IJ SOLN
1.0000 mg | Freq: Once | INTRAMUSCULAR | Status: AC
  Administered 2013-09-17: 1 mg via INTRAMUSCULAR

## 2013-09-17 MED ORDER — IOHEXOL 300 MG/ML  SOLN: 25.0000 mL | INTRAMUSCULAR | Status: AC

## 2013-09-17 MED ORDER — FENTANYL CITRATE 0.05 MG/ML IJ SOLN
50.0000 ug | Freq: Once | INTRAMUSCULAR | Status: AC
  Administered 2013-09-17: 50 ug via INTRAVENOUS
  Filled 2013-09-17: qty 2

## 2013-09-17 MED ORDER — ONDANSETRON HCL 4 MG/2ML IJ SOLN
4.0000 mg | Freq: Once | INTRAMUSCULAR | Status: AC
  Administered 2013-09-17: 4 mg via INTRAVENOUS
  Filled 2013-09-17: qty 2

## 2013-09-17 MED ORDER — HYDROMORPHONE HCL PF 1 MG/ML IJ SOLN
INTRAMUSCULAR | Status: AC
  Filled 2013-09-17: qty 1

## 2013-09-17 NOTE — ED Notes (Signed)
Pt c/o left flank pain onset yest Sxs also include: diarrhea, fevers Denies v/n, urinary sxs Alert w/no signs of acute distress.

## 2013-09-17 NOTE — ED Notes (Signed)
Pt ambulated to bathroom with assistance.

## 2013-09-17 NOTE — ED Provider Notes (Signed)
  Chief Complaint   Chief Complaint  Patient presents with  . Flank Pain    History of Present Illness   Mary Mccormick is a 58 year old female who's had a history since yesterday of localized pain in the left flank area. This is having pain and rated 10 over 10 at the most and now is an 8/10. Nothing makes it better or worse. It does not radiate. The patient has had anorexia, nausea, subjective fever, and chills. She's had several loose stools without blood or mucus. She denies any vomiting, urinary symptoms, or GYN complaints. She's never had anything like this before. She denies any history of diverticulosis, diverticulitis, colitis, kidney stones, or kidney infections.  Review of Systems   Other than as noted above, the patient denies any of the following symptoms: Constitutional:  No fever, chills, weight loss or anorexia. Lungs:  No cough or shortness of breath. Heart:  No chest pain, palpitations, syncope or edema.  Abdomen:  No nausea, vomiting, hematememesis, melena, diarrhea, or hematochezia. GU:  No dysuria, frequency, urgency, or hematuria. Gyn:  No vaginal discharge, itching, abnormal bleeding, dyspareunia, or pelvic pain.  PMFSH   Past medical history, family history, social history, meds, and allergies were reviewed. She is allergic to Tagamet. She has a history of asthma.  Physical Exam     Vital signs:  BP 100/64  Pulse 94  Temp(Src) 99 F (37.2 C) (Oral)  Resp 18  SpO2 99% Gen:  Alert, oriented, in no distress. Lungs:  Breath sounds clear and equal bilaterally.  No wheezes, rales or rhonchi. Heart:  Regular rhythm.  No gallops or murmers.   Abdomen:  Soft, flat, nondistended. No organomegaly or mass. Bowel sounds are normally active. She has localized tenderness to palpation in the left flank area with guarding and rebound. Skin:  Clear, warm and dry.  No rash.  Course in Urgent Care Center   Given Zofran ODT 8 mg sublingually and Dilaudid 1 mg  IM.  Assessment   The encounter diagnosis was Diverticulitis.  Differential diagnosis includes diverticulitis, colitis, kidney stone, kidney infection, ovarian cyst, or viral gastroenteritis. Will need further evaluation including abdominal CT scan.  Plan     The patient was transferred to the ED via shuttle in stable condition.  Medical Decision Making   58 year old female presents with 2 day history of left flank pain, subjective fever, chills, nausea, and diarrhea.  On exam she is exquisitely tender in left flank area with guarding suggesting diverticulitis.  We have give Dilaudid 1 mg IM and Zofran 4 mg IM and will transfer by shuttle.     Reuben Likesavid C Zaydenn Balaguer, MD 09/17/13 2014

## 2013-09-17 NOTE — ED Notes (Signed)
Pt c/o diarrhea and nausea since yesterday, states 12 episodes of diarrhea. Points to upper left quadrant for area of tenderness.

## 2013-09-17 NOTE — Discharge Instructions (Signed)
We have determined that your problem requires further evaluation in the emergency department.  We will take care of your transport there.  Once at the emergency department, you will be evaluated by a provider and they will order whatever treatment or tests they deem necessary.  We cannot guarantee that they will do any specific test or do any specific treatment.  ° °

## 2013-09-17 NOTE — ED Notes (Addendum)
[facility] LLQ pain/flank pain, diarrhea, nausea. [facility]

## 2013-09-18 ENCOUNTER — Encounter (HOSPITAL_COMMUNITY): Payer: Self-pay | Admitting: Radiology

## 2013-09-18 ENCOUNTER — Emergency Department (HOSPITAL_COMMUNITY): Payer: Self-pay

## 2013-09-18 LAB — CG4 I-STAT (LACTIC ACID): LACTIC ACID, VENOUS: 1.37 mmol/L (ref 0.5–2.2)

## 2013-09-18 MED ORDER — IOHEXOL 300 MG/ML  SOLN
100.0000 mL | Freq: Once | INTRAMUSCULAR | Status: AC | PRN
  Administered 2013-09-18: 100 mL via INTRAVENOUS

## 2013-09-18 NOTE — ED Provider Notes (Signed)
Medical screening examination/treatment/procedure(s) were conducted as a shared visit with non-physician practitioner(s) or resident  and myself.  I personally evaluated the patient during the encounter and agree with the findings and plan unless otherwise indicated.    I have personally reviewed any xrays and/ or EKG's with the provider and I agree with interpretation.   See separate note.   Enid SkeensJoshua M Avamae Dehaan, MD 09/18/13 [phone]

## 2013-09-18 NOTE — ED Notes (Addendum)
Contrast drank. Told Lorie in CT

## 2013-09-18 NOTE — ED Notes (Signed)
CG-4 result shown to [employer]UM! BrandsJulio Mccormick-Resident

## 2013-09-18 NOTE — Discharge Instructions (Signed)
Have mammogram done in the next two months to clarify small nodule seen on CT scan.  If you were given medicines take as directed.  If you are on coumadin or contraceptives realize their levels and effectiveness is altered by many different medicines.  If you have any reaction (rash, tongues swelling, other) to the medicines stop taking and see a physician.   Please follow up as directed and return to the ER or see a physician for new or worsening symptoms.  Thank you.

## 2013-09-18 NOTE — ED Provider Notes (Signed)
CSN: [id number]     Arrival date & time 09/17/13  2024 History   First MD Initiated Contact with Patient 09/17/13 2056     Chief Complaint  Patient presents with  . Diarrhea  . Abdominal Pain    Patient is a 58 y.o. female presenting with abdominal pain. The history is provided by the patient.  Abdominal Pain Pain location:  LLQ Pain quality: aching, cramping and squeezing   Pain radiates to:  Does not radiate Pain severity:  Mild Onset quality:  Gradual Duration:  2 days Timing:  Constant Progression:  Unchanged Chronicity:  New Context: alcohol use, previous surgery (hysterectomy) and recent sexual activity   Context: not awakening from sleep, not diet changes, not eating, not laxative use, not medication withdrawal, not recent illness, not recent travel and not trauma   Relieved by:  None tried Worsened by:  Palpation Ineffective treatments:  None tried Associated symptoms: diarrhea, fever and nausea   Associated symptoms: no anorexia, no belching, no chest pain, no chills, no cough, no dysuria, no fatigue, no flatus, no hematemesis, no hematochezia, no hematuria, no shortness of breath, no sore throat, no vaginal bleeding, no vaginal discharge and no vomiting       Past Medical History  Diagnosis Date  . Asthma   . Seasonal allergies   . Seizure    Past Surgical History  Procedure Laterality Date  . Total abdominal hysterectomy w/ bilateral salpingoophorectomy    . Appendectomy     History reviewed. No pertinent family history. History  Substance Use Topics  . Smoking status: Never Smoker   . Smokeless tobacco: Not on file  . Alcohol Use: 0.6 oz/week    1 Shots of liquor per week     Comment: occasional   OB History   Grav Para Term Preterm Abortions TAB SAB Ect Mult Living                 Review of Systems  Constitutional: Positive for fever. Negative for chills, activity change, appetite change and fatigue.  HENT: Negative for congestion, ear pain,  rhinorrhea and sore throat.   Eyes: Negative for pain.  Respiratory: Negative for cough and shortness of breath.   Cardiovascular: Negative for chest pain and palpitations.  Gastrointestinal: Positive for nausea, abdominal pain and diarrhea. Negative for vomiting, hematochezia, anorexia, flatus and hematemesis.  Genitourinary: Negative for dysuria, hematuria, vaginal bleeding, vaginal discharge, difficulty urinating and pelvic pain.  Musculoskeletal: Negative for back pain and neck pain.  Skin: Negative for rash and wound.  Neurological: Negative for weakness and headaches.  Psychiatric/Behavioral: Negative for behavioral problems, confusion and agitation.      Allergies  Codeine and Tagamet  Home Medications   Current Outpatient Rx  Name  Route  Sig  Dispense  Refill  . albuterol (PROVENTIL HFA;VENTOLIN HFA) 108 (90 BASE) MCG/ACT inhaler   Inhalation   Inhale 2 puffs into the lungs every 4 (four) hours as needed for wheezing or shortness of breath.   1 Inhaler   3   . dimenhyDRINATE (DRAMAMINE) 50 MG tablet   Oral   Take 50 mg by mouth every 8 (eight) hours as needed for nausea.         Marland Kitchen. ibuprofen (ADVIL,MOTRIN) 200 MG tablet   Oral   Take 600 mg by mouth every 6 (six) hours as needed for moderate pain.         Marland Kitchen. loperamide (IMODIUM A-D) 2 MG tablet   Oral  Take 4 mg by mouth 4 (four) times daily as needed for diarrhea or loose stools.          BP 108/59  Pulse 68  Temp(Src) 98.3 F (36.8 C)  Resp 18  Ht 5\' 1"  (1.549 m)  Wt 139 lb (63.05 kg)  BMI 26.28 kg/m2  SpO2 99% Physical Exam  Constitutional: She is oriented to person, place, and time. She appears well-developed and well-nourished. No distress.  HENT:  Head: Normocephalic and atraumatic.  Nose: Nose normal.  Mouth/Throat: Oropharynx is clear and moist.  Eyes: EOM are normal. Pupils are equal, round, and reactive to light.  Neck: Normal range of motion. Neck supple. No tracheal deviation present.   Cardiovascular: Normal rate, regular rhythm, normal heart sounds and intact distal pulses.   Pulmonary/Chest: Effort normal and breath sounds normal. She has no rales.  Abdominal: Soft. Bowel sounds are normal. She exhibits no distension. There is tenderness ( LLQ). There is no rebound and no guarding.  Genitourinary: No vaginal discharge found.  Musculoskeletal: Normal range of motion. She exhibits no tenderness.  Neurological: She is alert and oriented to person, place, and time.  Skin: Skin is warm and dry. No rash noted.  Psychiatric: She has a normal mood and affect. Her behavior is normal.    ED Course  Procedures (including critical care time) Labs Review  Results for orders placed during the hospital encounter of 09/17/13  CBC WITH DIFFERENTIAL      Result Value Ref Range   WBC 5.4  4.0 - 10.5 K/uL   RBC 4.62  3.87 - 5.11 MIL/uL   Hemoglobin 13.5  12.0 - 15.0 g/dL   HCT 11.9  14.7 - 82.9 %   MCV 85.3  78.0 - 100.0 fL   MCH 29.2  26.0 - 34.0 pg   MCHC 34.3  30.0 - 36.0 g/dL   RDW 56.2  13.0 - 86.5 %   Platelets 276  150 - 400 K/uL   Neutrophils Relative % 48  43 - 77 %   Neutro Abs 2.6  1.7 - 7.7 K/uL   Lymphocytes Relative 36  12 - 46 %   Lymphs Abs 1.9  0.7 - 4.0 K/uL   Monocytes Relative 10  3 - 12 %   Monocytes Absolute 0.5  0.1 - 1.0 K/uL   Eosinophils Relative 7 (*) 0 - 5 %   Eosinophils Absolute 0.4  0.0 - 0.7 K/uL   Basophils Relative 0  0 - 1 %   Basophils Absolute 0.0  0.0 - 0.1 K/uL  COMPREHENSIVE METABOLIC PANEL      Result Value Ref Range   Sodium 140  137 - 147 mEq/L   Potassium 3.8  3.7 - 5.3 mEq/L   Chloride 102  96 - 112 mEq/L   CO2 24  19 - 32 mEq/L   Glucose, Bld 104 (*) 70 - 99 mg/dL   BUN 9  6 - 23 mg/dL   Creatinine, Ser 7.84  0.50 - 1.10 mg/dL   Calcium 9.3  8.4 - 69.6 mg/dL   Total Protein 7.0  6.0 - 8.3 g/dL   Albumin 3.9  3.5 - 5.2 g/dL   AST 18  0 - 37 U/L   ALT 15  0 - 35 U/L   Alkaline Phosphatase 77  39 - 117 U/L   Total  Bilirubin 0.4  0.3 - 1.2 mg/dL   GFR calc non Af Amer 71 (*) >90 mL/min   GFR calc  Af Amer 82 (*) >90 mL/min  URINALYSIS, ROUTINE W REFLEX MICROSCOPIC      Result Value Ref Range   Color, Urine YELLOW  YELLOW   APPearance CLEAR  CLEAR   Specific Gravity, Urine 1.016  1.005 - 1.030   pH 5.5  5.0 - 8.0   Glucose, UA NEGATIVE  NEGATIVE mg/dL   Hgb urine dipstick TRACE (*) NEGATIVE   Bilirubin Urine NEGATIVE  NEGATIVE   Ketones, ur NEGATIVE  NEGATIVE mg/dL   Protein, ur NEGATIVE  NEGATIVE mg/dL   Urobilinogen, UA 0.2  0.0 - 1.0 mg/dL   Nitrite NEGATIVE  NEGATIVE   Leukocytes, UA TRACE (*) NEGATIVE  URINE MICROSCOPIC-ADD ON      Result Value Ref Range   Squamous Epithelial / LPF RARE  RARE   WBC, UA 3-6  <3 WBC/hpf   RBC / HPF 0-2  <3 RBC/hpf   Bacteria, UA RARE  RARE   Urine-Other MUCOUS PRESENT    CG4 I-STAT (LACTIC ACID)      Result Value Ref Range   Lactic Acid, Venous 1.37  0.5 - 2.2 mmol/L    Imaging Review No results found.  EKG Interpretation   None       MDM   Final diagnoses:  LLQ abdominal pain    58 yo F in NAD AFVSS non toxic appearing. Patient is here for LLQ abd pain x 2 days. Seen at urgent care center with concern for diverticulitis. Doubt obstruction given diarrhea. Patient is s/p total hysterectomy. No vaginal discharge or pain. UA without UTI. CRT wnl. No CVA ttp.  Doubt obstructing renal stone. No recent abx use , no leukocytosis doubt c diff infection. Will obtain CT abd/Pelvis.  Patient up dated multiple times throughout ED course. Pain well controlled with fentanyl. Of note patient states her blood pressure runs on the lower side. 1 L NS given in the ED.   1:33 AM Care discussed with my attending Dr. Gardiner Rhyme , care transferred. At sign out awaiting results of CT scan. Family up dated multiple times during ED course.        Nadara Mustard, MD 09/18/13 [phone]

## 2013-09-18 NOTE — ED Provider Notes (Signed)
Medical screening examination/treatment/procedure(s) were conducted as a shared visit with non-physician practitioner(s) or resident  and myself.  I personally evaluated the patient during the encounter and agree with the findings and plan unless otherwise indicated.    I have personally reviewed any xrays and/ or EKG's with the provider and I agree with interpretation.   Recurrent diarrhea and LLQ tenderness, intermittent, fever at home, no recent abx.  Exam LLQ and central tender mild, soft abdo, no guarding.  No improvement in ED.  CT abdo for diverticulitis/ colitis.  Pain meds and fluids. Labs unremarkable. Labs Reviewed  CBC WITH DIFFERENTIAL - Abnormal; Notable for the following:    Eosinophils Relative 7 (*)    All other components within normal limits  COMPREHENSIVE METABOLIC PANEL - Abnormal; Notable for the following:    Glucose, Bld 104 (*)    GFR calc non Af Amer 71 (*)    GFR calc Af Amer 82 (*)    All other components within normal limits  URINALYSIS, ROUTINE W REFLEX MICROSCOPIC - Abnormal; Notable for the following:    Hgb urine dipstick TRACE (*)    Leukocytes, UA TRACE (*)    All other components within normal limits  CLOSTRIDIUM DIFFICILE BY PCR  URINE MICROSCOPIC-ADD ON   Ct Abdomen Pelvis W Contrast  09/18/2013   CLINICAL DATA:  Left lower quadrant pain with nausea, vomiting, diarrhea and fever. History of appendectomy.  EXAM: CT ABDOMEN AND PELVIS WITH CONTRAST  TECHNIQUE: Multidetector CT imaging of the abdomen and pelvis was performed using the standard protocol following bolus administration of intravenous contrast.  CONTRAST:  100mL OMNIPAQUE IOHEXOL 300 MG/ML  SOLN  COMPARISON:  CT ABD W/CM dated 06/18/2009  FINDINGS: Limited view of the lung bases demonstrate mild dependent atelectasis, and are otherwise clear. Included heart and pericardium are unremarkable. 6 mm nodule in right breast, axial 2/.  The liver, spleen, pancreas, gallbladder and adrenal glands are  unremarkable and unchanged.  Stomach, small and large bowel are normal in course and caliber without inflammatory changes. Mild sigmoid diverticulosis without superimposed acute diverticulitis. Status post appendectomy. Contrast has yet to reach the large bowel. No intraperitoneal free fluid, fluid collections nor free air.  The kidneys are unremarkable and unchanged. Delayed imaging demonstrates prompt symmetric excretion into the proximal urinary collecting system. Urinary bladder is partially distended and unremarkable. Status post hysterectomy. Aortoiliac vessels are normal course and caliber, unremarkable. The inferior vena cava is slightly flattened.  Soft tissues are nonsuspicious, a tiny umbilical hernia containing fat. Osseous structures are nonsuspicious.  IMPRESSION: No acute intra-abdominal or pelvic process.  Slightly flattened inferior vena cava, recommend correlation with volume status.  6 mm nodule in right breast may reflect a lymph node though, would be better characterized on mammogram/ ultrasound as clinically indicated.   Electronically Signed   By: Awilda Metroourtnay  Bloomer   On: 09/18/2013 01:36   Discussed fup for mammogram outpt.  LLQ abdominal pain, Diarrhea  Enid SkeensJoshua M Sumayyah Custodio, MD 09/18/13 [phone]

## 2013-09-23 ENCOUNTER — Other Ambulatory Visit (HOSPITAL_COMMUNITY): Payer: Self-pay | Admitting: *Deleted

## 2013-09-23 DIAGNOSIS — N6310 Unspecified lump in the right breast, unspecified quadrant: Secondary | ICD-10-CM

## 2013-09-24 ENCOUNTER — Ambulatory Visit (HOSPITAL_COMMUNITY): Payer: Self-pay

## 2013-10-01 ENCOUNTER — Ambulatory Visit (HOSPITAL_COMMUNITY)
Admission: RE | Admit: 2013-10-01 | Discharge: 2013-10-01 | Disposition: A | Discharge status: Home / Self Care | Payer: Self-pay | Source: Ambulatory Visit | Attending: Obstetrics and Gynecology | Admitting: Obstetrics and Gynecology

## 2013-10-01 ENCOUNTER — Encounter (HOSPITAL_COMMUNITY): Payer: Self-pay

## 2013-10-01 VITALS — BP 100/68 | Temp 97.7°F | Ht 61.0 in | Wt 144.4 lb

## 2013-10-01 DIAGNOSIS — Z1239 Encounter for other screening for malignant neoplasm of breast: Secondary | ICD-10-CM

## 2013-10-01 NOTE — Progress Notes (Signed)
[facility] 6 mm breast nodule seen on CT scan 09/18/2013.   Pap Smear:  Pap smear not completed today. Last Pap smear was in 2000 and normal per patient. Per patient has a history of multiple abnormal Pap smears with the last abnormal Pap smear in 1998 with conization for follow-up. Per patient all Pap smears were normal after the conization was completed. Patient has a history of a hysterectomy for pain in 2000. Patient does not need any further Pap smears per BCCCP and ACOG guidelines. No Pap smear results in EPIC.  Physical exam: Breasts Breasts symmetrical. No skin abnormalities bilateral breasts. No nipple retraction bilateral breasts. No nipple discharge left breast. Patient complained of milky right breast discharge when expressed. Unable to express discharge on exam to send sample to cytology. No lymphadenopathy. No lumps palpated bilateral breasts. Complaints of bilateral outer and center breast tenderness on exam. [facility] Appointment scheduled for Thursday, October 03, 2013 at 1015.   Pelvic/Bimanual No Pap smear completed today since patient has a history of a hysterectomy for benign reasons. Pap smear not indicated per [facility]CP guidelines.

## 2013-10-01 NOTE — Patient Instructions (Signed)
Taught [name] how to perform BSE and gave educational materials to take home. Patient did not need a Pap smear today due to last Pap smear due to a history of a hysterectomy for benign reasons. Let patient know that she does not need any further Pap smears due to her history of a hysterectomy for benign reasons. [facility]. Appointment scheduled for Thursday, October 03, 2013 at 1015. Patient aware of appointment and will be there. Mary Mccormick verbalized understanding.  Kazden Largo, Kathaleen Maserhristine Poll, RN 3:49 PM

## 2013-10-03 ENCOUNTER — Ambulatory Visit
Admission: RE | Admit: 2013-10-03 | Discharge: 2013-10-03 | Disposition: A | Discharge status: Home / Self Care | Payer: No Typology Code available for payment source | Source: Ambulatory Visit | Attending: Obstetrics and Gynecology | Admitting: Obstetrics and Gynecology

## 2013-10-03 DIAGNOSIS — N6310 Unspecified lump in the right breast, unspecified quadrant: Secondary | ICD-10-CM

## 2013-10-11 ENCOUNTER — Other Ambulatory Visit: Payer: Self-pay

## 2013-10-11 ENCOUNTER — Ambulatory Visit: Payer: Self-pay

## 2013-10-18 ENCOUNTER — Ambulatory Visit: Payer: Self-pay

## 2013-10-18 ENCOUNTER — Other Ambulatory Visit: Payer: Self-pay

## 2013-11-08 ENCOUNTER — Other Ambulatory Visit: Payer: Self-pay

## 2013-11-08 ENCOUNTER — Ambulatory Visit: Payer: Self-pay

## 2013-11-18 ENCOUNTER — Encounter (HOSPITAL_COMMUNITY): Payer: Self-pay | Admitting: Emergency Medicine

## 2013-11-18 ENCOUNTER — Emergency Department (INDEPENDENT_AMBULATORY_CARE_PROVIDER_SITE_OTHER)
Admission: EM | Admit: 2013-11-18 | Discharge: 2013-11-18 | Disposition: A | Discharge status: Home / Self Care | Payer: Self-pay | Source: Home / Self Care

## 2013-11-18 DIAGNOSIS — F419 Anxiety disorder, unspecified: Secondary | ICD-10-CM

## 2013-11-18 DIAGNOSIS — R51 Headache: Secondary | ICD-10-CM

## 2013-11-18 DIAGNOSIS — J309 Allergic rhinitis, unspecified: Secondary | ICD-10-CM

## 2013-11-18 DIAGNOSIS — J329 Chronic sinusitis, unspecified: Secondary | ICD-10-CM

## 2013-11-18 DIAGNOSIS — R519 Headache, unspecified: Secondary | ICD-10-CM

## 2013-11-18 DIAGNOSIS — R451 Restlessness and agitation: Secondary | ICD-10-CM

## 2013-11-18 DIAGNOSIS — J45909 Unspecified asthma, uncomplicated: Secondary | ICD-10-CM

## 2013-11-18 MED ORDER — DEXAMETHASONE SODIUM PHOSPHATE 10 MG/ML IJ SOLN
10.0000 mg | Freq: Once | INTRAMUSCULAR | Status: AC
  Administered 2013-11-18: 10 mg via INTRAMUSCULAR

## 2013-11-18 MED ORDER — LORAZEPAM 2 MG/ML IJ SOLN
INTRAMUSCULAR | Status: AC
  Filled 2013-11-18: qty 1

## 2013-11-18 MED ORDER — METHYLPREDNISOLONE 4 MG PO KIT: PACK | ORAL | Status: DC

## 2013-11-18 MED ORDER — CEFUROXIME AXETIL 250 MG PO TABS: 250.0000 mg | ORAL_TABLET | Freq: Two times a day (BID) | ORAL | Status: DC

## 2013-11-18 MED ORDER — KETOROLAC TROMETHAMINE 60 MG/2ML IM SOLN
60.0000 mg | Freq: Once | INTRAMUSCULAR | Status: AC
  Administered 2013-11-18: 60 mg via INTRAMUSCULAR

## 2013-11-18 MED ORDER — LORAZEPAM 2 MG/ML IJ SOLN
2.0000 mg | Freq: Once | INTRAMUSCULAR | Status: AC
  Administered 2013-11-18: 2 mg via INTRAMUSCULAR

## 2013-11-18 MED ORDER — DEXAMETHASONE SODIUM PHOSPHATE 10 MG/ML IJ SOLN
INTRAMUSCULAR | Status: AC
Start: 2013-11-18 — End: 2013-11-18
  Filled 2013-11-18: qty 1

## 2013-11-18 MED ORDER — KETOROLAC TROMETHAMINE 60 MG/2ML IM SOLN
INTRAMUSCULAR | Status: AC
  Filled 2013-11-18: qty 2

## 2013-11-18 MED ORDER — LORAZEPAM 2 MG/ML IJ SOLN: 1.0000 mg | Freq: Once | INTRAMUSCULAR | Status: DC

## 2013-11-18 NOTE — ED Provider Notes (Signed)
CSN: [id number]     Arrival date & time 11/18/13  1212 History   First MD Initiated Contact with Patient 11/18/13 1351     Chief Complaint  Patient presents with  . URI   (Consider location/radiation/quality/duration/timing/severity/associated sxs/prior Treatment) HPI Comments: 58-year-old female presents with a three-day history of headache, sinus congestion, cough, body aches and chest congestion and nasal congestion. Of these she states her headache is the worst. She denies documented fevers. She states she has a history of asthma and is having shortness of breath due to the inability to breathe through her nose. She has taken Mucinex and sinus pills without relief. Chief complaint concern is that of a headache is located in the left parietal head. She is highly agitated, nervous and anxious. She is kicking her legs while sitting on a table she is bouncing up and down on the table and moving her arms wildly and grabbing her head.   Past Medical History  Diagnosis Date  . Asthma   . Seasonal allergies   . Seizure    Past Surgical History  Procedure Laterality Date  . Total abdominal hysterectomy w/ bilateral salpingoophorectomy    . Appendectomy     Family History  Problem Relation Age of Onset  . Hypertension Mother   . Thyroid disease Mother   . Diabetes Father   . Hypertension Father   . Cancer Maternal Aunt     thyroid  . Cancer Maternal Uncle     thyroid  . Hypertension Paternal Uncle   . Cancer Maternal Grandfather     colon   History  Substance Use Topics  . Smoking status: Never Smoker   . Smokeless tobacco: Not on file  . Alcohol Use: 0.6 oz/week    1 Shots of liquor per week     Comment: occasional   OB History   Grav Para Term Preterm Abortions TAB SAB Ect Mult Living   3 2 2  1     2      Review of Systems  Constitutional: Positive for activity change. Negative for fever, chills, appetite change and fatigue.  HENT: Positive for congestion, postnasal  drip, rhinorrhea and sinus pressure. Negative for facial swelling, sore throat and trouble swallowing.   Eyes: Negative.   Respiratory: Positive for cough and shortness of breath.   Cardiovascular: Positive for chest pain.  Gastrointestinal: Negative.   Musculoskeletal: Negative for neck pain and neck stiffness.  Skin: Negative for pallor and rash.  Neurological: Positive for tremors and headaches. Negative for dizziness, seizures, syncope, speech difficulty, weakness and numbness.  Psychiatric/Behavioral: Positive for agitation. The patient is nervous/anxious.     Allergies  Codeine and Tagamet  Home Medications   Prior to Admission medications   Medication Sig Start Date End Date Taking? Authorizing Provider  albuterol (PROVENTIL HFA;VENTOLIN HFA) 108 (90 BASE) MCG/ACT inhaler Inhale 2 puffs into the lungs every 4 (four) hours as needed for wheezing or shortness of breath. 05/31/12   Roxy Horsemanobert Browning, PA-C  dimenhyDRINATE (DRAMAMINE) 50 MG tablet Take 50 mg by mouth every 8 (eight) hours as needed for nausea.    Historical Provider, MD  ibuprofen (ADVIL,MOTRIN) 200 MG tablet Take 600 mg by mouth every 6 (six) hours as needed for moderate pain.    Historical Provider, MD  loperamide (IMODIUM A-D) 2 MG tablet Take 4 mg by mouth 4 (four) times daily as needed for diarrhea or loose stools.    Historical Provider, MD   BP 127/70  Pulse  90  Temp(Src) 98 F (36.7 C) (Oral)  Resp 16  SpO2 98% Physical Exam  Nursing note and vitals reviewed. Constitutional: She is oriented to person, place, and time. She appears well-developed and well-nourished. She appears distressed.  HENT:  Mouth/Throat: No oropharyngeal exudate.  Bilat TM's retracted. OP with copious thick white PND, cobblestoning.  Eyes: Conjunctivae are normal.  Neck: Normal range of motion. Neck supple.  Cardiovascular: Normal rate, regular rhythm and normal heart sounds.   Pulmonary/Chest: Effort normal and breath sounds  normal. No respiratory distress. She has no wheezes. She has no rales.  Musculoskeletal: Normal range of motion. She exhibits no edema.  Lymphadenopathy:    She has no cervical adenopathy.  Neurological: She is alert and oriented to person, place, and time.  Skin: Skin is warm and dry. No rash noted.  Psychiatric: Her mood appears anxious. Her affect is blunt. She is agitated.  Profound psychomotor agitation.    ED Course  Procedures (including critical care time) Labs Review Labs Reviewed - No data to display  Imaging Review No results found.   MDM   1. Sinusitis   2. Allergic rhinitis   3. Sinus headache   4. Asthma   5. Psychomotor agitation   6. Anxiety      30 min post injection of ativan 2 mg, Toradol 60 mg and decadron 10 mg pt is feeling a little better.  Will tx with  Ceftin, medrol dose pack, OTC sinus meds, nasal sprays. Use albuterol HFA as needed. Lungs clear today. D/C in stable and improved   Hayden RasmussenDavid Mayley Lish, NP 11/18/13 1512  Hayden Rasmussenavid Sarely Stracener, NP 11/18/13 1513

## 2013-11-18 NOTE — ED Notes (Signed)
PT    REPORTS  SYMPTOMS  OF   SORETHROAT  BODY  ACHES   AS  WELL  AS   A  NON  PRODUCTIVE  COUGH           WITH  THE  SYMPTOMS  X  3  DAYS       -  PT  AMBULATED  TO  ROOM  WITH A  STEADY  FLUID  GAIT            PT  REPORTS  SYMPTOMS  NOT  RELEIVED  BY  OTC  MED[facility]

## 2013-11-18 NOTE — Discharge Instructions (Signed)
Allergic Rhinitis allegra 180 mg a day Flonase nasal spray Saline nasal spray Sudafed PE 10 mg every 4 hours for congestion Asthma, Adult Asthma is a condition of the lungs in which the airways tighten and narrow. Asthma can make it hard to breathe. Asthma cannot be cured, but medicine and lifestyle changes can help control it. Asthma may be started (triggered) by:  Animal skin flakes (dander).  Dust.  Cockroaches.  Pollen.  Mold.  Smoke.  Cleaning products.  Hair sprays or aerosol sprays.  Paint fumes or strong smells.  Cold air, weather changes, and winds.  Crying or laughing hard.  Stress.  Certain medicines or drugs.  Foods, such as dried fruit, potato chips, and sparkling grape juice.  Infections or conditions (colds, flu).  Exercise.  Certain medical conditions or diseases.  Exercise or tiring activities. HOME CARE   Take medicine as told by your doctor.  Use a peak flow meter as told by your doctor. A peak flow meter is a tool that measures how well the lungs are working.  Record and keep track of the peak flow meter's readings.  Understand and use the asthma action plan. An asthma action plan is a written plan for taking care of your asthma and treating your attacks.  To help prevent asthma attacks:  Do not smoke. Stay away from secondhand smoke.  Change your heating and air conditioning filter often.  Limit your use of fireplaces and wood stoves.  Get rid of pests (such as roaches and mice) and their droppings.  Throw away plants if you see mold on them.  Clean your floors. Dust regularly. Use cleaning products that do not smell.  Have someone vacuum when you are not home. Use a vacuum cleaner with a HEPA filter if possible.  Replace carpet with wood, tile, or vinyl flooring. Carpet can trap animal skin flakes and dust.  Use allergy-proof pillows, mattress covers, and box spring covers.  Wash bed sheets and blankets every week in hot  water and dry them in a dryer.  Use blankets that are made of polyester or cotton.  Clean bathrooms and kitchens with bleach. If possible, have someone repaint the walls in these rooms with mold-resistant paint. Keep out of the rooms that are being cleaned and painted.  Wash hands often. GET HELP IF:  You have make a whistling sound when breaking (wheeze), have shortness of breath, or have a cough even if taking medicine to prevent attacks.  The colored mucus you cough up (sputum) is thicker than usual.  The colored mucus you cough up changes from clear or white to yellow, green, gray, or bloody.  You have problems from the medicine you are taking such as:  A rash.  Itching.  Swelling.  Trouble breathing.  You need reliever medicines more than 2 3 times a week.  Your peak flow measurement is still at 50 79% of your personal best after following the action plan for 1 hour. GET HELP RIGHT AWAY IF:   You seem to be worse and are not responding to medicine during an asthma attack.  You are short of breath even at rest.  You get short of breath when doing very little activity.  You have trouble eating, drinking, or talking.  You have chest pain.  You have a fast heartbeat.  Your lips or fingernails start to turn blue.  You are lightheaded, dizzy, or faint.  Your peak flow is less than 50% of your personal best.  You have a fever or lasting symptoms for more than 2 3 days.  You have a fever and your symptoms suddenly get worse. MAKE SURE YOU:   Understand these instructions.  Will watch your condition.  Will get help right away if you are not doing well or get worse. Document Released: 01/04/2008 Document Revised: 05/08/2013 Document Reviewed: 02/14/2013 [facility] Patient Information 2014 [place], [place].  Sinusitis Sinusitis is redness, soreness, and swelling (inflammation) of the paranasal sinuses. Paranasal sinuses are air pockets within the bones of your  face (beneath the eyes, the middle of the forehead, or above the eyes). In healthy paranasal sinuses, mucus is able to drain out, and air is able to circulate through them by way of your nose. However, when your paranasal sinuses are inflamed, mucus and air can become trapped. This can allow bacteria and other germs to grow and cause infection. Sinusitis can develop quickly and last only a short time (acute) or continue over a long period (chronic). Sinusitis that lasts for more than 12 weeks is considered chronic.  CAUSES  Causes of sinusitis include:  Allergies.  Structural abnormalities, such as displacement of the cartilage that separates your nostrils (deviated septum), which can decrease the air flow through your nose and sinuses and affect sinus drainage.  Functional abnormalities, such as when the small hairs (cilia) that line your sinuses and help remove mucus do not work properly or are not present. SYMPTOMS  Symptoms of acute and chronic sinusitis are the same. The primary symptoms are pain and pressure around the affected sinuses. Other symptoms include:  Upper toothache.  Earache.  Headache.  Bad breath.  Decreased sense of smell and taste.  A cough, which worsens when you are lying flat.  Fatigue.  Fever.  Thick drainage from your nose, which often is green and may contain pus (purulent).  Swelling and warmth over the affected sinuses. DIAGNOSIS  Your caregiver will perform a physical exam. During the exam, your caregiver may:  Look in your nose for signs of abnormal growths in your nostrils (nasal polyps).  Tap over the affected sinus to check for signs of infection.  View the inside of your sinuses (endoscopy) with a special imaging device with a light attached (endoscope), which is inserted into your sinuses. If your caregiver suspects that you have chronic sinusitis, one or more of the following tests may be recommended:  Allergy tests.  Nasal culture A  sample of mucus is taken from your nose and sent to a lab and screened for bacteria.  Nasal cytology A sample of mucus is taken from your nose and examined by your caregiver to determine if your sinusitis is related to an allergy. TREATMENT  Most cases of acute sinusitis are related to a viral infection and will resolve on their own within 10 days. Sometimes medicines are prescribed to help relieve symptoms (pain medicine, decongestants, nasal steroid sprays, or saline sprays).  However, for sinusitis related to a bacterial infection, your caregiver will prescribe antibiotic medicines. These are medicines that will help kill the bacteria causing the infection.  Rarely, sinusitis is caused by a fungal infection. In theses cases, your caregiver will prescribe antifungal medicine. For some cases of chronic sinusitis, surgery is needed. Generally, these are cases in which sinusitis recurs more than 3 times per year, despite other treatments. HOME CARE INSTRUCTIONS   Drink plenty of water. Water helps thin the mucus so your sinuses can drain more easily.  Use a humidifier.  Inhale steam  3 to 4 times a day (for example, sit in the bathroom with the shower running).  Apply a warm, moist washcloth to your face 3 to 4 times a day, or as directed by your caregiver.  Use saline nasal sprays to help moisten and clean your sinuses.  Take over-the-counter or prescription medicines for pain, discomfort, or fever only as directed by your caregiver. SEEK IMMEDIATE MEDICAL CARE IF:  You have increasing pain or severe headaches.  You have nausea, vomiting, or drowsiness.  You have swelling around your face.  You have vision problems.  You have a stiff neck.  You have difficulty breathing. MAKE SURE YOU:   Understand these instructions.  Will watch your condition.  Will get help right away if you are not doing well or get worse. Document Released: 07/18/2005 Document Revised: 10/10/2011 Document  Reviewed: 08/02/2011 [facility] Patient Information 2014 [place], [place].  Sinus Headache A sinus headache happens when your sinuses become clogged or puffy (swollen). Sinus headaches can be mild or severe. HOME CARE  Take your medicines (antibiotics) as told. Finish them even if you start to feel better.  Only take medicine as told by your doctor.  Use a nose spray if you feel stuffed up (congested). GET HELP RIGHT AWAY IF:  You have a fever.  You have trouble seeing.  You suddenly have pain in your face or head.  You start to twitch or shake (seizure).  You are confused.  You get headaches more than once a week.  Light or sound bothers you.  You feel sick to your stomach (nauseous) or throw up (vomit).  Your headaches do not get better with treatment. MAKE SURE YOU:  Understand these instructions.  Will watch your condition.  Will get help right away if you are not doing well or get worse. Document Released: 11/17/2010 Document Revised: 10/10/2011 Document Reviewed: 11/17/2010 [facility] Patient Information 2014 [place], [place].  Allergic rhinitis is when the mucous membranes in the nose respond to allergens. Allergens are particles in the air that cause your body to have an allergic reaction. This causes you to release allergic antibodies. Through a chain of events, these eventually cause you to release histamine into the blood stream. Although meant to protect the body, it is this release of histamine that causes your discomfort, such as frequent sneezing, congestion, and an itchy, runny nose.  CAUSES  Seasonal allergic rhinitis (hay fever) is caused by pollen allergens that may come from grasses, trees, and weeds. Year-round allergic rhinitis (perennial allergic rhinitis) is caused by allergens such as house dust mites, pet dander, and mold spores.  SYMPTOMS   Nasal stuffiness (congestion).  Itchy, runny nose with sneezing and tearing of the eyes. DIAGNOSIS  Your  health care provider can help you determine the allergen or allergens that trigger your symptoms. If you and your health care provider are unable to determine the allergen, skin or blood testing may be used. TREATMENT  Allergic Rhinitis does not have a cure, but it can be controlled by:  Medicines and allergy shots (immunotherapy).  Avoiding the allergen. Hay fever may often be treated with antihistamines in pill or nasal spray forms. Antihistamines block the effects of histamine. There are over-the-counter medicines that may help with nasal congestion and swelling around the eyes. Check with your health care provider before taking or giving this medicine.  If avoiding the allergen or the medicine prescribed do not work, there are many new medicines your health care provider can prescribe. Stronger  medicine may be used if initial measures are ineffective. Desensitizing injections can be used if medicine and avoidance does not work. Desensitization is when a patient is given ongoing shots until the body becomes less sensitive to the allergen. Make sure you follow up with your health care provider if problems continue. HOME CARE INSTRUCTIONS It is not possible to completely avoid allergens, but you can reduce your symptoms by taking steps to limit your exposure to them. It helps to know exactly what you are allergic to so that you can avoid your specific triggers. SEEK MEDICAL CARE IF:   You have a fever.  You develop a cough that does not stop easily (persistent).  You have shortness of breath.  You start wheezing.  Symptoms interfere with normal daily activities. Document Released: 04/12/2001 Document Revised: 05/08/2013 Document Reviewed: 03/25/2013 [facility]Exit[facility] Patient Information 2014 [place]ExitCare, [place].

## 2013-11-20 NOTE — ED Provider Notes (Signed)
Medical screening examination/treatment/procedure(s) were performed by a resident physician or non-physician practitioner and as the supervising physician I was immediately available for consultation/collaboration.  Tisha Cline, MD    Kleigh Hoelzer S Tatisha Cerino, MD 11/20/13 0652 

## 2013-11-22 ENCOUNTER — Other Ambulatory Visit: Payer: Self-pay

## 2013-11-22 ENCOUNTER — Ambulatory Visit: Payer: Self-pay

## 2014-06-02 ENCOUNTER — Encounter (HOSPITAL_COMMUNITY): Payer: Self-pay | Admitting: Emergency Medicine

## 2015-12-20 ENCOUNTER — Encounter (HOSPITAL_COMMUNITY): Payer: Self-pay

## 2015-12-20 ENCOUNTER — Emergency Department (HOSPITAL_COMMUNITY): Payer: Self-pay

## 2015-12-20 ENCOUNTER — Emergency Department (HOSPITAL_COMMUNITY)
Admission: EM | Admit: 2015-12-20 | Discharge: 2015-12-20 | Disposition: A | Discharge status: Home / Self Care | Payer: Self-pay | Attending: Emergency Medicine | Admitting: Emergency Medicine

## 2015-12-20 DIAGNOSIS — Z791 Long term (current) use of non-steroidal anti-inflammatories (NSAID): Secondary | ICD-10-CM | POA: Insufficient documentation

## 2015-12-20 DIAGNOSIS — M542 Cervicalgia: Secondary | ICD-10-CM | POA: Insufficient documentation

## 2015-12-20 DIAGNOSIS — J45909 Unspecified asthma, uncomplicated: Secondary | ICD-10-CM | POA: Insufficient documentation

## 2015-12-20 DIAGNOSIS — J0120 Acute ethmoidal sinusitis, unspecified: Secondary | ICD-10-CM | POA: Insufficient documentation

## 2015-12-20 LAB — BASIC METABOLIC PANEL
ANION GAP: 6 (ref 5–15)
BUN: 12 mg/dL (ref 6–20)
CALCIUM: 8.8 mg/dL — AB (ref 8.9–10.3)
CO2: 26 mmol/L (ref 22–32)
CREATININE: 0.68 mg/dL (ref 0.44–1.00)
Chloride: 106 mmol/L (ref 101–111)
GFR calc Af Amer: >60 mL/min (ref >60)
GLUCOSE: 117 mg/dL — AB (ref 65–99)
Potassium: 3.3 mmol/L — ABNORMAL LOW (ref 3.5–5.1)
Sodium: 138 mmol/L (ref 135–145)

## 2015-12-20 LAB — CBC WITH DIFFERENTIAL/PLATELET
BASOS ABS: 0.1 10*3/uL (ref 0.0–0.1)
BASOS PCT: 0 %
EOS PCT: 9 %
Eosinophils Absolute: 1.1 10*3/uL — ABNORMAL HIGH (ref 0.0–0.7)
HEMATOCRIT: 36.2 % (ref 36.0–46.0)
Hemoglobin: 11.8 g/dL — ABNORMAL LOW (ref 12.0–15.0)
Lymphocytes Relative: 18 %
Lymphs Abs: 2.1 10*3/uL (ref 0.7–4.0)
MCH: 28 pg (ref 26.0–34.0)
MCHC: 32.6 g/dL (ref 30.0–36.0)
MCV: 86 fL (ref 78.0–100.0)
MONO ABS: 0.7 10*3/uL (ref 0.1–1.0)
MONOS PCT: 6 %
NEUTROS ABS: 7.7 10*3/uL (ref 1.7–7.7)
Neutrophils Relative %: 67 %
PLATELETS: 402 10*3/uL — AB (ref 150–400)
RBC: 4.21 MIL/uL (ref 3.87–5.11)
RDW: 12.8 % (ref 11.5–15.5)
WBC: 11.6 10*3/uL — ABNORMAL HIGH (ref 4.0–10.5)

## 2015-12-20 MED ORDER — AMOXICILLIN-POT CLAVULANATE 875-125 MG PO TABS: 1.0000 | ORAL_TABLET | Freq: Two times a day (BID) | ORAL | Status: DC

## 2015-12-20 MED ORDER — DEXAMETHASONE SODIUM PHOSPHATE 10 MG/ML IJ SOLN
10.0000 mg | Freq: Once | INTRAMUSCULAR | Status: AC
  Administered 2015-12-20: 10 mg via INTRAMUSCULAR
  Filled 2015-12-20: qty 1

## 2015-12-20 MED ORDER — AMOXICILLIN-POT CLAVULANATE 875-125 MG PO TABS
1.0000 | ORAL_TABLET | Freq: Once | ORAL | Status: AC
  Administered 2015-12-20: 1 via ORAL
  Filled 2015-12-20: qty 1

## 2015-12-20 MED ORDER — ACETAMINOPHEN 325 MG PO TABS
650.0000 mg | ORAL_TABLET | Freq: Once | ORAL | Status: AC
  Administered 2015-12-20: 650 mg via ORAL
  Filled 2015-12-20: qty 2

## 2015-12-20 NOTE — Discharge Instructions (Signed)
Your symptoms are likely due to a sinus infection. You'll be treated for this with antibiotics. Your exam, CT and MRI of your head were reassuring. Please follow-up with your doctor in the next 2-3 days for reevaluation. It is also important for you to follow-up with ophthalmology for further management of your blurred vision. Return to ED for any new or worsening symptoms as we discussed.  Sinusitis, Adult Sinusitis is redness, soreness, and inflammation of the paranasal sinuses. Paranasal sinuses are air pockets within the bones of your face. They are located beneath your eyes, in the middle of your forehead, and above your eyes. In healthy paranasal sinuses, mucus is able to drain out, and air is able to circulate through them by way of your nose. However, when your paranasal sinuses are inflamed, mucus and air can become trapped. This can allow bacteria and other germs to grow and cause infection. Sinusitis can develop quickly and last only a short time (acute) or continue over a long period (chronic). Sinusitis that lasts for more than 12 weeks is considered chronic. CAUSES Causes of sinusitis include:  Allergies.  Structural abnormalities, such as displacement of the cartilage that separates your nostrils (deviated septum), which can decrease the air flow through your nose and sinuses and affect sinus drainage.  Functional abnormalities, such as when the small hairs (cilia) that line your sinuses and help remove mucus do not work properly or are not present. SIGNS AND SYMPTOMS Symptoms of acute and chronic sinusitis are the same. The primary symptoms are pain and pressure around the affected sinuses. Other symptoms include:  Upper toothache.  Earache.  Headache.  Bad breath.  Decreased sense of smell and taste.  A cough, which worsens when you are lying flat.  Fatigue.  Fever.  Thick drainage from your nose, which often is green and may contain pus (purulent).  Swelling and  warmth over the affected sinuses. DIAGNOSIS Your health care provider will perform a physical exam. During your exam, your health care provider may perform any of the following to help determine if you have acute sinusitis or chronic sinusitis:  Look in your nose for signs of abnormal growths in your nostrils (nasal polyps).  Tap over the affected sinus to check for signs of infection.  View the inside of your sinuses using an imaging device that has a light attached (endoscope). If your health care provider suspects that you have chronic sinusitis, one or more of the following tests may be recommended:  Allergy tests.  Nasal culture. A sample of mucus is taken from your nose, sent to a lab, and screened for bacteria.  Nasal cytology. A sample of mucus is taken from your nose and examined by your health care provider to determine if your sinusitis is related to an allergy. TREATMENT Most cases of acute sinusitis are related to a viral infection and will resolve on their own within 10 days. Sometimes, medicines are prescribed to help relieve symptoms of both acute and chronic sinusitis. These may include pain medicines, decongestants, nasal steroid sprays, or saline sprays. However, for sinusitis related to a bacterial infection, your health care provider will prescribe antibiotic medicines. These are medicines that will help kill the bacteria causing the infection. Rarely, sinusitis is caused by a fungal infection. In these cases, your health care provider will prescribe antifungal medicine. For some cases of chronic sinusitis, surgery is needed. Generally, these are cases in which sinusitis recurs more than 3 times per year, despite other treatments. HOME  CARE INSTRUCTIONS  Drink plenty of water. Water helps thin the mucus so your sinuses can drain more easily.  Use a humidifier.  Inhale steam 3-4 times a day (for example, sit in the bathroom with the shower running).  Apply a warm,  moist washcloth to your face 3-4 times a day, or as directed by your health care provider.  Use saline nasal sprays to help moisten and clean your sinuses.  Take medicines only as directed by your health care provider.  If you were prescribed either an antibiotic or antifungal medicine, finish it all even if you start to feel better. SEEK IMMEDIATE MEDICAL CARE IF:  You have increasing pain or severe headaches.  You have nausea, vomiting, or drowsiness.  You have swelling around your face.  You have vision problems.  You have a stiff neck.  You have difficulty breathing.   This information is not intended to replace advice given to you by your health care provider. Make sure you discuss any questions you have with your health care provider.   Document Released: 07/18/2005 Document Revised: 08/08/2014 Document Reviewed: 08/02/2011 [employer] Interactive Patient Education [employer]

## 2015-12-20 NOTE — ED Notes (Signed)
Pt with headache, neck pain, earache, sinus congestion x 2 weeks.  Facial pain.

## 2015-12-20 NOTE — ED Notes (Signed)
Patient transported to CT, delay in labs

## 2015-12-20 NOTE — ED Notes (Signed)
Patient transported to MRI @ 1640

## 2015-12-20 NOTE — ED Provider Notes (Signed)
CSN: [id number]     Arrival date & time 12/20/15  1059 History   First MD Initiated Contact with Patient 12/20/15 1214     Chief Complaint  Patient presents with  . Neck Pain  . Migraine     (Consider location/radiation/quality/duration/timing/severity/associated sxs/prior Treatment) HPI Mary Mccormick is a 60 y.o. female here for evaluation of facial pain, sinus congestion, headache and neck pain. Patient reports symptoms have been ongoing for the past 2 weeks. She reports associated right-sided facial pain, right sided neck and head pain. She has tried OTC sinus medications without relief. She reports pain and pressure around her right eye with associated blurred vision that started this morning. Denies any other numbness or weakness. No other alleviating or aggravating factors. No history of hypertension, hyperlipidemia, smoking, diabetes. She does report a family history of heart disease with her father.  Past Medical History  Diagnosis Date  . Asthma   . Seasonal allergies   . Seizure [name])    Past Surgical History  Procedure Laterality Date  . Total abdominal hysterectomy w/ bilateral salpingoophorectomy    . Appendectomy     Family History  Problem Relation Age of Onset  . Hypertension Mother   . Thyroid disease Mother   . Diabetes Father   . Hypertension Father   . Cancer Maternal Aunt     thyroid  . Cancer Maternal Uncle     thyroid  . Hypertension Paternal Uncle   . Cancer Maternal Grandfather     colon   Social History  Substance Use Topics  . Smoking status: Never Smoker   . Smokeless tobacco: None  . Alcohol Use: 0.6 oz/week    1 Shots of liquor per week     Comment: occasional   OB History    Gravida Para Term Preterm AB TAB SAB Ectopic Multiple Living   Review of Systems A 10 point review of systems was completed and was negative except for pertinent positives and negatives as mentioned in the history of present illness      Allergies  Codeine and Tagamet  Home Medications   Prior to Admission medications   Medication Sig Start Date End Date Taking? Authorizing Provider  beta carotene w/minerals (OCUVITE) tablet Take 1 tablet by mouth daily.   Yes Historical Provider, MD  fexofenadine (ALLEGRA) 180 MG tablet Take 180 mg by mouth daily.   Yes Historical Provider, MD  ibuprofen (ADVIL,MOTRIN) 200 MG tablet Take 600 mg by mouth every 6 (six) hours as needed for moderate pain.   Yes Historical Provider, MD  Multiple Vitamin (MULTIVITAMIN WITH MINERALS) TABS tablet Take 1 tablet by mouth daily.   Yes Historical Provider, MD  oxymetazoline (AFRIN) 0.05 % nasal spray Place 1 spray into both nostrils daily as needed for congestion.   Yes Historical Provider, MD  Pseudoephedrine-Acetaminophen (SINUS PO) Take 1 tablet by mouth every 4 (four) hours as needed (pain.).   Yes Historical Provider, MD  albuterol (PROVENTIL HFA;VENTOLIN HFA) 108 (90 BASE) MCG/ACT inhaler Inhale 2 puffs into the lungs every 4 (four) hours as needed for wheezing or shortness of breath. Patient not taking: Reported on 12/20/2015 05/31/12   [name], PA-C  amoxicillin-clavulanate (AUGMENTIN) 875-125 MG tablet Take 1 tablet by mouth every 12 (twelve) hours. 12/20/15   [name], PA-C  cefUROXime (CEFTIN) 250 MG tablet Take 1 tablet (250 mg total) by mouth 2 (two) times daily  with a meal. Patient not taking: Reported on 12/20/2015 11/18/13   [name], NP  methylPREDNISolone (MEDROL DOSEPAK) 4 MG tablet follow package directions. Take with food. Start 11/19/2013 Patient not taking: Reported on 12/20/2015 11/18/13   [name], NP   BP 117/46 mmHg  Pulse 72  Temp(Src) 98.1 F (36.7 C) (Oral)  Resp 16  SpO2 99% Physical Exam  Constitutional: She is oriented to person, place, and time. She appears well-developed and well-nourished.  HENT:  Head: Normocephalic and atraumatic.  Mouth/Throat: Oropharynx is clear and moist.  Tenderness  in right maxillary sinus and mild in right temple region. No erythema, fluctuance. No palpable vessels  Eyes: Conjunctivae are normal. Pupils are equal, round, and reactive to light. Right eye exhibits no discharge. Left eye exhibits no discharge. No scleral icterus.  Extraocular movements intact without nystagmus.  Neck: Neck supple.  No meningismus or nuchal rigidity  Cardiovascular: Normal rate, regular rhythm and normal heart sounds.   Pulmonary/Chest: Effort normal and breath sounds normal. No respiratory distress. She has no wheezes. She has no rales.  Abdominal: Soft. There is no tenderness.  Musculoskeletal: She exhibits no tenderness.  Neurological: She is alert and oriented to person, place, and time.  PE reports decreased sensation over right forehead region. She also reports decreased sensation in her right calf region. Motor strength 5/5, but decreased in right upper extremity with decreased grip strength. Also decreased in lower extremity with knee flexion and extension. No pronator drift, however patient seems to bounce her hands in the air and cannot keep them still. Has very blunted and clumsy finger to nose coordination. Suspect some degree of poor effort on patient's part. She was seen earlier complained 5 motor or nasal movements without any difficulty whatsoever. Very aware of  spatial orientation  Skin: Skin is warm and dry. No rash noted.  Psychiatric: She has a normal mood and affect.  Nursing note and vitals reviewed.   ED Course  Procedures (including critical care time) Labs Review Labs Reviewed  BASIC METABOLIC PANEL - Abnormal; Notable for the following:    Potassium 3.3 (*)    Glucose, Bld 117 (*)    Calcium 8.8 (*)    All other components within normal limits  CBC WITH DIFFERENTIAL/PLATELET - Abnormal; Notable for the following:    WBC 11.6 (*)    Hemoglobin 11.8 (*)    Platelets 402 (*)    Eosinophils Absolute 1.1 (*)    All other components within  normal limits    Imaging Review Ct Head Wo Contrast  12/20/2015  CLINICAL DATA:  Headache and neck pain.  Facial pain. EXAM: CT HEAD WITHOUT CONTRAST TECHNIQUE: Contiguous axial images were obtained from the base of the skull through the vertex without intravenous contrast. COMPARISON:  May 13, 2015 FINDINGS: Mucosal thickening is seen in scattered ethmoid air cells. Paranasal sinuses, mastoid air cells, and middle ears are otherwise normal. The bones and extracranial soft tissues are within normal limits. No subdural, epidural, or subarachnoid hemorrhage. Ventricles and sulci are normal. Cerebellum, brainstem, and basal cisterns are within normal limits. No mass, mass effect, or midline shift. No acute cortical ischemia or infarct. IMPRESSION: 1. Mild sinus disease with mucosal thickening in the ethmoid sinuses. 2. No acute intracranial process. Electronically Signed   By: Gerome Sam III M.D   On: 12/20/2015 14:09   I have personally reviewed and evaluated these images and lab results as part of my medical decision-making.   EKG Interpretation None  Visual Acuity  Right Eye Distance:   Left Eye Distance:   Bilateral Distance:    Right Eye Near: R Near: 20/200 Left Eye Near:  L Near: 20/50 Bilateral Near:  20/200  Meds given in ED:  Medications  dexamethasone (DECADRON) injection 10 mg (not administered)  amoxicillin-clavulanate (AUGMENTIN) 875-125 MG per tablet 1 tablet (1 tablet Oral Given 12/20/15 1600)  acetaminophen (TYLENOL) tablet 650 mg (650 mg Oral Given 12/20/15 1600)    New Prescriptions   AMOXICILLIN-CLAVULANATE (AUGMENTIN) 875-125 MG TABLET    Take 1 tablet by mouth every 12 (twelve) hours.   Filed Vitals:   12/20/15 1113 12/20/15 1224 12/20/15 1502  BP: 133/80 126/62 117/46  Pulse: 88 66 72  Temp: 98.1 F (36.7 C)    TempSrc: Oral    Resp: 16 16 16   SpO2: 100% 98% 99%    MDM  Terrianna Luna KitchensG Craigo is a 60 y.o. female presents for evaluation of  right-sided facial pain ongoing for the past 2 weeks. She does mention some component of blurred vision that started today? Question Cooperation during neuro exam. Due to complaint of headache and vision changes, we will obtain CT head. CT head shows evidence of mild sinus disease and ethmoid sinuses, possible source of patient's discomfort. No other intracranial process. However, without dependable neuro exam, we'll obtain MR brain to evaluate for possible posterior infarct. If negative, I anticipate patient may be discharged home with antibiotics for sinusitis and follow-up with ophthalmology for further evaluation of blurred vision. Low suspicion at this time for GCA, however given 10 mg of dexamethasone in ED. discussed with my attending, Dr. Cyndie ChimeNguyen, who also saw and evaluated patient and agrees with above plan.  Patient care signed out to oncoming provider, Soms PA-C for further follow-up on MRI subsequent disposition.  Final diagnoses:  Acute ethmoidal sinusitis, recurrence not specified       Joycie PeekBenjamin Donyel Nester, PA-C 12/22/15 [phone]  Leta BaptistEmily Roe Nguyen, MD 12/27/15 [phone]

## 2015-12-20 NOTE — ED Provider Notes (Signed)
Care assumed from [facility] Cart[facility], PA-C, at shift change. Pt here with 2wks of sinus congestion, earache, neck pain, and headache, but also has some vision changes and her visual acuity is off [Visual Acuity - Bilateral Near: 20/200 ; R Near: 20/200 ; L Near: 20/50]. Results so far show BMP with mildly low K 3.3 doubt need for repletion; CBC w/diff with mildly elevated WBC but no changes on differential. CT head showed ethmoid sinusitis but otherwise WNL. Awaiting MRI brain to ensure no abnormalities. Plan is to likely d/c home with augmentin for sinusitis assuming the MRI is negative, with ophthalmology referral for possibly new glasses.    Physical Exam  BP 117/46 mmHg  Pulse 72  Temp(Src) 98.1 F (36.7 C) (Oral)  Resp 16  SpO2 99%  Physical Exam Gen: afebrile, VSS, NAD HEENT: EOMI, MMM Resp: no resp distress CV: rate WNL Abd: appearance normal, nondistended MsK: moving all extremities with ease Neuro: A&O x4  ED Course  Procedures Results for orders placed or performed during the hospital encounter of 12/20/15  Basic metabolic panel  Result Value Ref Range   Sodium 138 135 - 145 mmol/L   Potassium 3.3 (L) 3.5 - 5.1 mmol/L   Chloride 106 101 - 111 mmol/L   CO2 26 22 - 32 mmol/L   Glucose, Bld 117 (H) 65 - 99 mg/dL   BUN 12 6 - 20 mg/dL   Creatinine, Ser 4.090.68 0.44 - 1.00 mg/dL   Calcium 8.8 (L) 8.9 - 10.3 mg/dL   GFR calc non Af Amer >60 >60 mL/min   GFR calc Af Amer >60 >60 mL/min   Anion gap 6 5 - 15  CBC with Differential  Result Value Ref Range   WBC 11.6 (H) 4.0 - 10.5 K/uL   RBC 4.21 3.87 - 5.11 MIL/uL   Hemoglobin 11.8 (L) 12.0 - 15.0 g/dL   HCT 81.136.2 91.436.0 - 78.246.0 %   MCV 86.0 78.0 - 100.0 fL   MCH 28.0 26.0 - 34.0 pg   MCHC 32.6 30.0 - 36.0 g/dL   RDW 95.612.8 21.311.5 - 08.615.5 %   Platelets 402 (H) 150 - 400 K/uL   Neutrophils Relative % 67 %   Neutro Abs 7.7 1.7 - 7.7 K/uL   Lymphocytes Relative 18 %   Lymphs Abs 2.1 0.7 - 4.0 K/uL   Monocytes Relative 6 %   Monocytes  Absolute 0.7 0.1 - 1.0 K/uL   Eosinophils Relative 9 %   Eosinophils Absolute 1.1 (H) 0.0 - 0.7 K/uL   Basophils Relative 0 %   Basophils Absolute 0.1 0.0 - 0.1 K/uL   Ct Head Wo Contrast  12/20/2015  CLINICAL DATA:  Headache and neck pain.  Facial pain. EXAM: CT HEAD WITHOUT CONTRAST TECHNIQUE: Contiguous axial images were obtained from the base of the skull through the vertex without intravenous contrast. COMPARISON:  May 13, 2015 FINDINGS: Mucosal thickening is seen in scattered ethmoid air cells. Paranasal sinuses, mastoid air cells, and middle ears are otherwise normal. The bones and extracranial soft tissues are within normal limits. No subdural, epidural, or subarachnoid hemorrhage. Ventricles and sulci are normal. Cerebellum, brainstem, and basal cisterns are within normal limits. No mass, mass effect, or midline shift. No acute cortical ischemia or infarct. IMPRESSION: 1. Mild sinus disease with mucosal thickening in the ethmoid sinuses. 2. No acute intracranial process. Electronically Signed   By: Gerome Samavid  Williams III M.D   On: 12/20/2015 14:09   Mr Brain Wo Contrast (neuro Protocol)  12/20/2015  CLINICAL DATA:  Right-sided blurred vision.  Headache. EXAM: MRI HEAD WITHOUT CONTRAST TECHNIQUE: Multiplanar, multiecho pulse sequences of the brain and surrounding structures were obtained without intravenous contrast. COMPARISON:  CT head without contrast 12/20/2015. FINDINGS: The diffusion-weighted images demonstrate no evidence for acute or subacute infarction. No acute hemorrhage or mass lesion is present. There is no significant white matter disease. The ventricles are of normal size. No significant extraaxial fluid collection is present. The internal auditory canals are within normal limits bilaterally. The brainstem and cerebellum are normal. Flow is present in the major intracranial arteries. The globes and orbits are intact. Mild mucosal thickening is present in the ethmoid air cells and  maxillary sinuses bilaterally the sphenoid sinuses and frontal sinuses are clear. The mastoid air cells are clear. Skullbase is within normal limits. Midline sagittal images are unremarkable. IMPRESSION: 1. Normal MRI appearance of the brain. No acute or focal lesion to explain right-sided blurred vision or headache. 2. Mild mucosal disease of the ethmoid air cells and maxillary sinuses. Electronically Signed   By: Marin Roberts M.D.   On: 12/20/2015 17:09     Meds ordered this encounter  Medications  . amoxicillin-clavulanate (AUGMENTIN) 875-125 MG per tablet 1 tablet    Sig:   . acetaminophen (TYLENOL) tablet 650 mg    Sig:   . amoxicillin-clavulanate (AUGMENTIN) 875-125 MG tablet    Sig: Take 1 tablet by mouth every 12 (twelve) hours.    Dispense:  14 tablet    Refill:  0    Order Specific Question:  Supervising Provider    Answer:  MILLER, BRIAN [[id number]]  . dexamethasone (DECADRON) injection 10 mg    Sig:      MDM:   ICD-9-CM ICD-10-CM   1. Acute ethmoidal sinusitis, recurrence not specified 461.2 J01.20    5:34 PM MRI negative aside from the sinusitis which had already been identified on the CT. At this time, will d/c home with previously discussed plan per Mayme Genta PA-C (augmentin and PCP and ophthalmology f/up). I explained the diagnosis and have given explicit precautions to return to the ER including for any other new or worsening symptoms. The patient understands and accepts the medical plan as it's been dictated and I have answered their questions. Discharge instructions concerning home care and prescriptions have been given. The patient is STABLE and is discharged to home in good condition.   BP 117/46 mmHg  Pulse 72  Temp(Src) 98.1 F (36.7 C) (Oral)  Resp 16  SpO2 99%   Mary Vernet Camprubi-Soms, PA-C 12/20/15 1735  Leta Baptist, MD 12/27/15 1434

## 2016-02-10 ENCOUNTER — Ambulatory Visit: Payer: Self-pay | Admitting: Internal Medicine

## 2016-02-23 ENCOUNTER — Ambulatory Visit (INDEPENDENT_AMBULATORY_CARE_PROVIDER_SITE_OTHER): Payer: Self-pay | Admitting: Internal Medicine

## 2016-02-23 ENCOUNTER — Encounter: Payer: Self-pay | Admitting: Internal Medicine

## 2016-02-23 VITALS — BP 122/60 | HR 74 | Resp 18 | Ht 61.0 in | Wt 131.0 lb

## 2016-02-23 DIAGNOSIS — J349 Unspecified disorder of nose and nasal sinuses: Secondary | ICD-10-CM

## 2016-02-23 MED ORDER — AMOXICILLIN-POT CLAVULANATE 875-125 MG PO TABS: 1.0000 | ORAL_TABLET | Freq: Two times a day (BID) | ORAL | 0 refills | Status: AC
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
## 2016-02-23 NOTE — Patient Instructions (Signed)
1/4 tsp salt in 1 cup water --snort handful as often as needed. Would use water at room temperature.  Okay to continue flonase and Claritin  Call Honor for appt.:  [phone]

## 2016-02-24 ENCOUNTER — Ambulatory Visit: Payer: Self-pay | Admitting: Internal Medicine

## 2016-03-07 ENCOUNTER — Encounter: Payer: Self-pay | Admitting: Internal Medicine

## 2016-03-07 ENCOUNTER — Ambulatory Visit (INDEPENDENT_AMBULATORY_CARE_PROVIDER_SITE_OTHER): Payer: Self-pay | Admitting: Licensed Clinical Social Worker

## 2016-03-07 ENCOUNTER — Ambulatory Visit (INDEPENDENT_AMBULATORY_CARE_PROVIDER_SITE_OTHER): Payer: Self-pay | Admitting: Internal Medicine

## 2016-03-07 VITALS — BP 122/70 | HR 80 | Resp 16 | Ht 60.25 in | Wt 138.0 lb

## 2016-03-07 DIAGNOSIS — F319 Bipolar disorder, unspecified: Secondary | ICD-10-CM

## 2016-03-07 DIAGNOSIS — F439 Reaction to severe stress, unspecified: Secondary | ICD-10-CM

## 2016-03-07 DIAGNOSIS — J329 Chronic sinusitis, unspecified: Secondary | ICD-10-CM

## 2016-03-07 DIAGNOSIS — J4520 Mild intermittent asthma, uncomplicated: Secondary | ICD-10-CM

## 2016-03-07 DIAGNOSIS — Z658 Other specified problems related to psychosocial circumstances: Secondary | ICD-10-CM

## 2016-03-07 DIAGNOSIS — F1411 Cocaine abuse, in remission: Secondary | ICD-10-CM

## 2016-03-07 DIAGNOSIS — J302 Other seasonal allergic rhinitis: Secondary | ICD-10-CM

## 2016-03-07 DIAGNOSIS — Z87898 Personal history of other specified conditions: Secondary | ICD-10-CM

## 2016-03-07 DIAGNOSIS — R569 Unspecified convulsions: Secondary | ICD-10-CM

## 2016-03-07 NOTE — Progress Notes (Signed)
   Subjective:    Patient ID: Mary Mccormick, female    DOB: [birth year], 60 y.o.   MRN: [id number]  HPI   1.  Sinusitis:  Finished Augmentin 3 days ago, was much better and then developed a sore throat since.  Awakened soaked over the weekend.  States pulled out a strawberry from her sinus cleaning.  Has not had strawberries since July 4th.   2.  Bipolar Disorder:  Has not been [facility] to get back [facility].  Problems with transportation.  Current Meds  Medication Sig  . Ascorbic Acid (VITAMIN C) 100 MG tablet Take 100 mg by mouth daily.  . fluticasone (FLONASE) 50 MCG/ACT nasal spray Place 1 spray into both nostrils daily.  Marland Kitchen. ibuprofen (ADVIL,MOTRIN) 200 MG tablet Take 600 mg by mouth every 6 (six) hours as needed for moderate pain.  Marland Kitchen. loratadine (CLARITIN) 10 MG tablet Take 10 mg by mouth daily.  . Multiple Vitamin (MULTIVITAMIN WITH MINERALS) TABS tablet Take 1 tablet by mouth daily.  . vitamin B-12 (CYANOCOBALAMIN) 500 MCG tablet Take 500 mcg by mouth daily.    Allergies  Allergen Reactions  . Codeine Nausea And Vomiting  . Tagamet [Cimetidine] Itching and Rash         Review of Systems     Objective:   Physical Exam NAD Nasal voice a bit less evident. HEENT:  PERRL, EOMI, TMs pearly gray, throat with mild redness of anterior tonsillar pillars.  No exudate. Again, no nasal septum with wide open nasal cavity behind nose.  Posterior wall with thickened brown covering. Neck:  Supple, no adenopathy. Chest:  CTA CV:  RRR without murmur or rub       Assessment & Plan:  1.  Recurrent sinus/nasal cavity complaints.   Call out to Dr. Ezzard StandingNewman. Addendum after patient discharged:   Ultimately, she is not a surgical candidate as she does not have enough bone behind her nose to close the septum. She may have damage to her swallowing mechanism from her history of cocaine abuse as well and that can be evaluated by Modified Barium swallow to see if food particles are making their way  into the nasal cavity as she describes. The exudate on her posterior nasal cavity would just reform if surgically debrided, the saline rinses are her best bet to keep this under control. Will cancel ENT referral and get her set up with Modified Barium Swallow once we have an idea that she meets some [facility].   Patient states she does not meet criteria for orange card.  Discussed with her she would also need to find a different primary then as well, but that we can finish with this evaluation.  2.  Bipolar Disorder:  [facility]eds [facility].

## 2016-03-07 NOTE — Patient Instructions (Addendum)
Keep working on sinus toilet--twice daily saline irrigations Hard swallow with each bite of food and follow by water.  Would consider irrigating sinuses after meals as well. Dr. Ezzard StandingNewman to let me know what he recommends tomorrow.  [facility] will [facility] into [facility] or [facility] if Dr. Ezzard StandingNewman unable to see you. Call [facility] number Dennie Bibleat gives you to see about ACA and finding a better deal.

## 2016-03-08 ENCOUNTER — Telehealth (HOSPITAL_COMMUNITY): Payer: Self-pay | Admitting: *Deleted

## 2016-03-08 NOTE — Telephone Encounter (Signed)
Telephoned patient at home # and voicemail box not set up unable to leave message.

## 2016-03-08 NOTE — Progress Notes (Signed)
   THERAPY PROGRESS NOTE  Session Time: 45min   Participation Level: Active  Behavioral Response: Neat and Well GroomedAlertAnxious  Type of Therapy: Individual Therapy  Treatment Goals addressed: Coping  Interventions: Supportive  Summary: Mary Mccormick is a 60 y.o. female who presents with an anxious mood and appropriate affect. She reported that she did not know why she had been referred to counseling, then stated, "Maybe it's because I'm bipolar." She shared that she was diagnosed with bipolar disorder many years ago and that previously she had been on Depakote, which worked well for her. She reported that she had not been on Depakote for the past 9 years because she couldn't afford it. Alesandra shared that she has significant stress with her adult daughter, who is "crazy as a loon," and causes a lot of drama within the family. She reported that she also has a strained relationship with her adult son due to a conflict over work/money. She reported stress due to financial difficulties. Debie shared that she had gone to counseling in the past and that "it didn't work for me." She stated that she was not interested in ongoing counseling at this time.  Suicidal/Homicidal: Nowithout intent/plan  Therapist Response: LCSW utilized supportive counseling techniques throughout the session in order to validate emotions and encourage open expression of emotion. LCSW began the clinical assessment but was unable to finish due to time constraints. LCSW encouraged Alexxa to [facility] if she is interested in going on medication again. LCSW encouraged her to return to counseling any time in the future if needed.  Plan: Return again in 0 weeks.  Diagnosis: Axis I: See current hospital problem list    Axis II: No diagnosis    Nilda Simmeratosha Lavonta Tillis, LCSW 03/08/2016

## 2016-03-10 ENCOUNTER — Telehealth: Payer: Self-pay

## 2016-03-10 NOTE — Telephone Encounter (Signed)
Dr. Allayne Stackrossley's office called about the patient's upcoming ENT appointment. Canceled referral and appt with ENT over the phone. Patient to have a speech therapy referral entered by [facility] Delrae [facility]ulberry [facility].

## 2016-03-15 NOTE — Telephone Encounter (Signed)
Thank you Please check with patient and see if she did paperwork for Financial assistance from [facility] to decrease her bill from May ED visit.   If she did, how much was her billed decreased? I am asking as after discussion with Dr. Ezzard StandingNewman, he does not feel she has a surgical solution with her sinuses.  He feels her best bet is to continue the saline rinses. With regards to the food that gets in her sinuses, we decided we should have her evaluated with a modified barium swallow to see if her swallowing mechanism was damaged with the nasal septum, which can be the case. I can send her for this to speech [facility] at [facility], but do not have any coverage for her except for the financial assistance she can apply for through [facility].

## 2016-03-17 NOTE — Telephone Encounter (Signed)
Patient is calling today in extreme pain in her face and wants another antibiotic. She is going to try to call [facility] to see if they can help signing her up with an ACA plan as she states she does not qualify for financial assistance thru [facility]. She is upset that Dr. Ezzard StandingNewman doesn't recommend surgery. She says she does saline rinse everyday and would like a call back.

## 2016-03-18 ENCOUNTER — Other Ambulatory Visit: Payer: Self-pay | Admitting: Internal Medicine

## 2016-03-18 DIAGNOSIS — R1310 Dysphagia, unspecified: Secondary | ICD-10-CM

## 2016-03-18 NOTE — Telephone Encounter (Signed)
I would like for her to really work with flushing her sinuses 3 times daily with warm saline via a Neti Pot.  The problem is the stuff getting into her sinuses.   I do not want to treat another time with antibiotics as she will likely develop a resistant bacteria.  Has she already applied for [facility] and been turned down?   Dr. Ezzard StandingNewman stated she did not have enough bone in her nose to close up the area and that's why she is not a surgical candidate.   She needs to have a swallow study to see why the food is getting up into her sinuses.  Dr. Ezzard StandingNewman discussed with me that the ability of the posterior nasopharynx to send food in the right direction can be damaged as the nasal septum was damaged and he agreed she needed a test called a modified barium swallow [facility].

## 2016-03-18 NOTE — Telephone Encounter (Signed)
Please see encounter dated today with orders for speech pathology and modified barium swallow. Please set up referral and let them know the swallow is ordered as well.

## 2016-03-18 NOTE — Telephone Encounter (Signed)
Referral faxed. Will call for appointment on 03/21/2016

## 2016-03-18 NOTE — Telephone Encounter (Signed)
Patient informed of this information. Says she has financial assistance with [facility] but that she thought it was only for the emergency department. She is okay with scheduling with speech therapy.

## 2016-03-21 ENCOUNTER — Other Ambulatory Visit: Payer: Self-pay | Admitting: Internal Medicine

## 2016-03-21 DIAGNOSIS — R1310 Dysphagia, unspecified: Secondary | ICD-10-CM

## 2016-03-23 NOTE — Telephone Encounter (Signed)
VM box for patient not set up.

## 2016-03-23 NOTE — Telephone Encounter (Signed)
Patient aware of appointment

## 2016-04-04 ENCOUNTER — Ambulatory Visit (HOSPITAL_COMMUNITY): Payer: Self-pay

## 2016-04-05 ENCOUNTER — Ambulatory Visit (HOSPITAL_COMMUNITY)
Admission: RE | Admit: 2016-04-05 | Discharge: 2016-04-05 | Disposition: A | Discharge status: Home / Self Care | Payer: Self-pay | Source: Ambulatory Visit | Attending: Internal Medicine | Admitting: Internal Medicine

## 2016-04-05 DIAGNOSIS — Z029 Encounter for administrative examinations, unspecified: Secondary | ICD-10-CM | POA: Insufficient documentation

## 2016-04-05 DIAGNOSIS — R1310 Dysphagia, unspecified: Secondary | ICD-10-CM

## 2016-04-05 NOTE — Progress Notes (Signed)
MBSS complete. Full report located under chart review in imaging section.  Scout Gumbs Paiewonsky, M.A. CCC-SLP [phone]  

## 2016-04-11 ENCOUNTER — Other Ambulatory Visit: Payer: Self-pay | Admitting: Obstetrics and Gynecology

## 2016-04-11 DIAGNOSIS — Z1231 Encounter for screening mammogram for malignant neoplasm of breast: Secondary | ICD-10-CM

## 2016-04-22 ENCOUNTER — Ambulatory Visit (HOSPITAL_COMMUNITY): Payer: Self-pay

## 2016-05-10 ENCOUNTER — Encounter: Payer: Self-pay | Admitting: Internal Medicine

## 2016-05-10 DIAGNOSIS — J302 Other seasonal allergic rhinitis: Secondary | ICD-10-CM | POA: Insufficient documentation

## 2016-05-10 DIAGNOSIS — F319 Bipolar disorder, unspecified: Secondary | ICD-10-CM

## 2016-05-10 DIAGNOSIS — J329 Chronic sinusitis, unspecified: Secondary | ICD-10-CM | POA: Insufficient documentation

## 2016-05-10 DIAGNOSIS — R569 Unspecified convulsions: Secondary | ICD-10-CM | POA: Insufficient documentation

## 2016-05-10 DIAGNOSIS — J45909 Unspecified asthma, uncomplicated: Secondary | ICD-10-CM | POA: Insufficient documentation

## 2016-05-10 DIAGNOSIS — F1411 Cocaine abuse, in remission: Secondary | ICD-10-CM | POA: Insufficient documentation

## 2016-05-10 HISTORY — DX: Bipolar disorder, unspecified: F31.9

## 2016-05-19 ENCOUNTER — Ambulatory Visit (HOSPITAL_COMMUNITY): Payer: Self-pay

## 2017-07-18 ENCOUNTER — Encounter (HOSPITAL_COMMUNITY): Payer: Self-pay

## 2017-08-10 ENCOUNTER — Encounter (HOSPITAL_COMMUNITY): Payer: Self-pay | Admitting: Emergency Medicine

## 2017-08-10 ENCOUNTER — Other Ambulatory Visit: Payer: Self-pay

## 2017-08-10 ENCOUNTER — Ambulatory Visit (INDEPENDENT_AMBULATORY_CARE_PROVIDER_SITE_OTHER): Payer: Self-pay

## 2017-08-10 ENCOUNTER — Ambulatory Visit (HOSPITAL_COMMUNITY)
Admission: EM | Admit: 2017-08-10 | Discharge: 2017-08-10 | Disposition: A | Discharge status: Home / Self Care | Payer: Self-pay | Attending: Family Medicine | Admitting: Family Medicine

## 2017-08-10 DIAGNOSIS — R05 Cough: Secondary | ICD-10-CM

## 2017-08-10 DIAGNOSIS — R059 Cough, unspecified: Secondary | ICD-10-CM

## 2017-08-10 MED ORDER — BENZONATATE 100 MG PO CAPS: 100.0000 mg | ORAL_CAPSULE | Freq: Three times a day (TID) | ORAL | 0 refills | Status: DC

## 2017-08-10 MED ORDER — AZITHROMYCIN 250 MG PO TABS: 250.0000 mg | ORAL_TABLET | Freq: Every day | ORAL | 0 refills | Status: DC

## 2017-08-10 NOTE — ED Triage Notes (Signed)
Pt c/o feeling sick x1 month, coughing, HA, fevers.

## 2017-08-10 NOTE — ED Provider Notes (Signed)
[facility]   [id number] 08/10/17 Arrival Time: 1004  ASSESSMENT & PLAN:  1. Cough     Meds ordered this encounter  Medications  . azithromycin (ZITHROMAX) 250 MG tablet    Sig: Take 1 tablet (250 mg total) by mouth daily. Take first 2 tablets together, then 1 every day until finished.    Dispense:  6 tablet    Refill:  0  . benzonatate (TESSALON) 100 MG capsule    Sig: Take 1 capsule (100 mg total) by mouth every 8 (eight) hours.    Dispense:  21 capsule    Refill:  0   No infiltrate on CXR today. OTC symptom care as needed. Ensure adequate fluid intake and rest. May f/u with PCP or here as needed.  Reviewed expectations re: course of current medical issues. Questions answered. Outlined signs and symptoms indicating need for more acute intervention. Patient verbalized understanding. After Visit Summary given.   SUBJECTIVE: History from: patient.  Mary Mccormick is a 62 y.o. female who presents with complaint of nasal congestion, post-nasal drainage, and a persistent dry cough. Onset abrupt, approximately 3-4 weeks ago. Off and on exacerbations. Cough is bothering her the most; affecting sleep. Overall fatigued. SOB: none. Wheezing: questions at times. Fever: "not sure but feel chilled sometimes". Overall normal PO intake without n/v. Sick contacts: no. OTC treatment: cough medication without relief. Received flu shot this year: no. Social History   Tobacco Use  Smoking Status Never Smoker  Smokeless Tobacco Never Used    ROS: As per HPI.   OBJECTIVE:  Vitals:   08/10/17 1022  BP: (!) 120/56  Pulse: 77  Resp: 18  Temp: 98.6 F (37 C)  TempSrc: Oral  SpO2: 99%     General appearance: alert; appears fatigued HEENT: nasal congestion; clear runny nose; throat irritation secondary to post-nasal drainage Neck: supple without LAD CV: RRR Lungs: unlabored respirations, symmetrical air entry; cough: moderate; no respiratory distress Skin: warm and  dry Psychological: alert and cooperative; normal mood and affect  Imaging: Dg Chest 2 View  Result Date: 08/10/2017 CLINICAL DATA:  Head and chest congestion with productive cough for over 1 month. EXAM: CHEST  2 VIEW COMPARISON:  PA and lateral chest 05/31/2012 and 10/14/2010. FINDINGS: Lungs are clear. Heart size is normal. There is no pneumothorax or pleural effusion. No bony abnormality. IMPRESSION: No acute disease. Electronically Signed   By: Drusilla Kanner M.D.   On: 08/10/2017 10:48     Allergies  Allergen Reactions  . Codeine Nausea And Vomiting  . Tagamet [Cimetidine] Itching and Rash    Past Medical History:  Diagnosis Date  . Asthma   . Bipolar disorder (HCC) 05/10/2016  . Seasonal allergies   . 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  Was on Phenobarbital at one time--perhaps moved her to depakote   Family History  Problem Relation Age of Onset  . Hypertension Mother   . Thyroid disease Mother   . Diabetes Father   . Hypertension Father   . Heart disease Father   . Cancer Maternal Grandfather        colon  . Cancer Maternal Aunt        thyroid  . Cancer Maternal Uncle        thyroid  . Hypertension Paternal Uncle   . Asthma Brother    Social History   Socioeconomic History  . Marital status: Divorced    Spouse name: Not on file  .  Number of children: 2  . Years of education: college  . Highest education level: Not on file  Social Needs  . Financial resource strain: Not on file  . Food insecurity - worry: Not on file  . Food insecurity - inability: Not on file  . Transportation needs - medical: Not on file  . Transportation needs - non-medical: Not on file  Occupational History  . Occupation: unemployed  Tobacco Use  . Smoking status: Never Smoker  . Smokeless tobacco: Never Used  Substance and Sexual Activity  . Alcohol use: Yes    Alcohol/week: 0.6 oz    Types: 1 Shots of liquor per week    Comment: occasional  . Drug use: No     Comment: History of cocaine abuse  . Sexual activity: Not Currently    Birth control/protection: None, Surgical  Other Topics Concern  . Not on file  Social History Narrative   Lives with current husband, Netta CedarsKenneth Smart.   He is employed           Mardella LaymanHagler, Alfonso Carden, MD 08/10/17 1123

## 2018-01-31 ENCOUNTER — Ambulatory Visit (HOSPITAL_COMMUNITY)
Admission: EM | Admit: 2018-01-31 | Discharge: 2018-01-31 | Disposition: A | Discharge status: Home / Self Care | Payer: Self-pay | Attending: Family Medicine | Admitting: Family Medicine

## 2018-01-31 ENCOUNTER — Encounter (HOSPITAL_COMMUNITY): Payer: Self-pay | Admitting: Emergency Medicine

## 2018-01-31 DIAGNOSIS — R0602 Shortness of breath: Secondary | ICD-10-CM

## 2018-01-31 DIAGNOSIS — R062 Wheezing: Secondary | ICD-10-CM

## 2018-01-31 DIAGNOSIS — J4521 Mild intermittent asthma with (acute) exacerbation: Secondary | ICD-10-CM

## 2018-01-31 MED ORDER — IPRATROPIUM-ALBUTEROL 0.5-2.5 (3) MG/3ML IN SOLN
RESPIRATORY_TRACT | Status: AC
Start: 2018-01-31 — End: ?
  Filled 2018-01-31: qty 3

## 2018-01-31 MED ORDER — ALBUTEROL SULFATE HFA 108 (90 BASE) MCG/ACT IN AERS: 1.0000 | INHALATION_SPRAY | Freq: Four times a day (QID) | RESPIRATORY_TRACT | 0 refills | Status: DC | PRN

## 2018-01-31 MED ORDER — PREDNISONE 20 MG PO TABS: 40.0000 mg | ORAL_TABLET | Freq: Every day | ORAL | 0 refills | Status: AC

## 2018-01-31 MED ORDER — IPRATROPIUM-ALBUTEROL 0.5-2.5 (3) MG/3ML IN SOLN
3.0000 mL | Freq: Once | RESPIRATORY_TRACT | Status: AC
  Administered 2018-01-31: 3 mL via RESPIRATORY_TRACT

## 2018-01-31 MED ORDER — PSEUDOEPH-BROMPHEN-DM 30-2-10 MG/5ML PO SYRP: 5.0000 mL | ORAL_SOLUTION | Freq: Four times a day (QID) | ORAL | 0 refills | Status: DC | PRN

## 2018-01-31 NOTE — ED Triage Notes (Signed)
Pt here with asthma sx; pt sts inhaler helping some

## 2018-01-31 NOTE — Discharge Instructions (Signed)
Continue daily allergy pill Please use albuterol inhaler as needed for shortness of breath, chest tightness and wheezing Please take prednisone 40 mg daily for the next 5 days Please use cough syrup as needed  Please return in follow-up if symptoms worsening, developing worsening shortness of breath, difficulty breathing, fever, symptoms not improving

## 2018-02-01 NOTE — ED Provider Notes (Signed)
[facility]    CSN: [id number] Arrival date & time: 01/31/18  1332     History   Chief Complaint Chief Complaint  Patient presents with  . Asthma    HPI Mary Mccormick is a 62 y.o. female history of asthma, bipolar, and seasonal allergies presenting today for evaluation of asthma flare.  Patient states that she has had increased shortness of breath over the past few days.  She has been using an inhaler of a friend that she does not have her own.  States that it does help some, but does not like how jittery it makes her feel.  Endorsing significant coughing.  Denies fevers.  Also endorsing congestion and occasional nosebleeds, but states that this is related to previous "bad habits".  HPI  Past Medical History:  Diagnosis Date  . Asthma   . Bipolar disorder (HCC) 05/10/2016  . Seasonal allergies   . 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  Was on Phenobarbital at one time--perhaps moved her to depakote    Patient Active Problem List   Diagnosis Date Noted  . Sinusitis 05/10/2016  . History of cocaine abuse 05/10/2016  . Bipolar disorder (HCC) 05/10/2016  . Asthma   . Seasonal allergies   . Seizure [facility](HCC)     Past Surgical History:  Procedure Laterality Date  . APPENDECTOMY    . OOPHORECTOMY     Not clear which side--per patient removed subsequent to TAH for ovarian cancer  . TOTAL ABDOMINAL HYSTERECTOMY     With unilateral oophorectomy    OB History    Gravida  3   Para  2   Term  2   Preterm      AB  1   Living  2     SAB      TAB      Ectopic      Multiple      Live Births               Home Medications    Prior to Admission medications   Medication Sig Start Date End Date Taking? Authorizing Provider  albuterol (PROVENTIL HFA;VENTOLIN HFA) 108 (90 Base) MCG/ACT inhaler Inhale 1-2 puffs into the lungs every 6 (six) hours as needed for wheezing or shortness of breath. 01/31/18   Adaysha Dubinsky C, PA-C  Ascorbic Acid  (VITAMIN C) 100 MG tablet Take 100 mg by mouth daily.    [provider]  azithromycin (ZITHROMAX) 250 MG tablet Take 1 tablet (250 mg total) by mouth daily. Take first 2 tablets together, then 1 every day until finished. 08/10/17   Mardella LaymanHagler, Brian, MD  benzonatate (TESSALON) 100 MG capsule Take 1 capsule (100 mg total) by mouth every 8 (eight) hours. 08/10/17   Mardella LaymanHagler, Brian, MD  beta carotene w/minerals (OCUVITE) tablet Take 1 tablet by mouth daily.    [provider]  brompheniramine-pseudoephedrine-DM 30-2-10 MG/5ML syrup Take 5 mLs by mouth 4 (four) times daily as needed. 01/31/18   Elias Bordner C, PA-C  divalproex (DEPAKOTE) 500 MG DR tablet Take 500 mg by mouth 2 (two) times daily.    [provider]  fluticasone (FLONASE) 50 MCG/ACT nasal spray Place 1 spray into both nostrils daily.    [provider]  ibuprofen (ADVIL,MOTRIN) 200 MG tablet Take 600 mg by mouth every 6 (six) hours as needed for moderate pain.    [provider]  loratadine (CLARITIN) 10 MG tablet Take 10 mg  by mouth daily.    [provider]  Multiple Vitamin (MULTIVITAMIN WITH MINERALS) TABS tablet Take 1 tablet by mouth daily.    [provider]  predniSONE (DELTASONE) 20 MG tablet Take 2 tablets (40 mg total) by mouth daily for 5 days. 01/31/18 02/05/18  Victorina Kable C, PA-C  vitamin B-12 (CYANOCOBALAMIN) 500 MCG tablet Take 500 mcg by mouth daily.    [provider]    Family History Family History  Problem Relation Age of Onset  . Hypertension Mother   . Thyroid disease Mother   . Diabetes Father   . Hypertension Father   . Heart disease Father   . Cancer Maternal Grandfather        colon  . Cancer Maternal Aunt        thyroid  . Cancer Maternal Uncle        thyroid  . Hypertension Paternal Uncle   . Asthma Brother     Social History Social History   Tobacco Use  . Smoking status: Never Smoker  . Smokeless tobacco: Never Used    Substance Use Topics  . Alcohol use: Yes    Alcohol/week: 0.6 oz    Types: 1 Shots of liquor per week    Comment: occasional  . Drug use: No    Comment: History of cocaine abuse     Allergies   Codeine and Tagamet [cimetidine]   Review of Systems Review of Systems  Constitutional: Negative for activity change, appetite change, chills, fatigue and fever.  HENT: Positive for congestion, nosebleeds and rhinorrhea. Negative for ear pain, sinus pressure, sore throat and trouble swallowing.   Eyes: Negative for discharge and redness.  Respiratory: Positive for cough, chest tightness, shortness of breath and wheezing.   Cardiovascular: Negative for chest pain.  Gastrointestinal: Negative for abdominal pain, diarrhea, nausea and vomiting.  Musculoskeletal: Negative for myalgias.  Skin: Negative for rash.  Neurological: Negative for dizziness, light-headedness and headaches.     Physical Exam Triage Vital Signs ED Triage Vitals [01/31/18 1359]  Enc Vitals Group     BP 127/66     Pulse Rate 89     Resp 18     Temp 98.1 F (36.7 C)     Temp Source Oral     SpO2 99 %     Weight      Height      Head Circumference      Peak Flow      Pain Score      Pain Loc      Pain Edu?      Excl. in GC?    No data found.  Updated Vital Signs BP 127/66 (BP Location: Right Arm)   Pulse 89   Temp 98.1 F (36.7 C) (Oral)   Resp 18   SpO2 99%   Visual Acuity Right Eye Distance:   Left Eye Distance:   Bilateral Distance:    Right Eye Near:   Left Eye Near:    Bilateral Near:     Physical Exam  Constitutional: She appears well-developed and well-nourished. No distress.  HENT:  Head: Normocephalic and atraumatic.  Bilateral ears without tenderness to palpation of external auricle, tragus and mastoid, EAC's without erythema or swelling, TM's with good bony landmarks and cone of light. Non erythematous.  Nasal mucosa erythematous, appears irritated with areas of dried  blood  Oral mucosa pink and moist, no tonsillar enlargement or exudate. Posterior pharynx patent and nonerythematous, no uvula deviation  or swelling. Normal phonation.  Eyes: Conjunctivae are normal.  Neck: Neck supple.  Cardiovascular: Normal rate and regular rhythm.  No murmur heard. Pulmonary/Chest: Effort normal. No respiratory distress.  Mild end expiratory wheezing bilaterally throughout lung fields, improved after DuoNeb  Abdominal: Soft. There is no tenderness.  Musculoskeletal: She exhibits no edema.  Neurological: She is alert.  Skin: Skin is warm and dry.  Psychiatric: She has a normal mood and affect.  Nursing note and vitals reviewed.    UC Treatments / Results  Labs (all labs ordered are listed, but only abnormal results are displayed) Labs Reviewed - No data to display  EKG None  Radiology No results found.  Procedures Procedures (including critical care time)  Medications Ordered in UC Medications  ipratropium-albuterol (DUONEB) 0.5-2.5 (3) MG/3ML nebulizer solution 3 mL (3 mLs Nebulization Given 01/31/18 1435)    Initial Impression / Assessment and Plan / UC Course  I have reviewed the triage vital signs and the nursing notes.  Pertinent labs & imaging results that were available during my care of the patient were reviewed by me and considered in my medical decision making (see chart for details).    Patient provided with DuoNeb in clinic, improved shortness of breath.  Vital signs stable.  Will send home with prescription for albuterol inhaler, prednisone for for 5 days, cough syrup provided.  Continue daily allergy pill.Discussed strict return precautions. Patient verbalized understanding and is agreeable with plan.   Final Clinical Impressions(s) / UC Diagnoses   Final diagnoses:  Mild intermittent asthma with exacerbation     Discharge Instructions     Continue daily allergy pill Please use albuterol inhaler as needed for shortness of breath,  chest tightness and wheezing Please take prednisone 40 mg daily for the next 5 days Please use cough syrup as needed  Please return in follow-up if symptoms worsening, developing worsening shortness of breath, difficulty breathing, fever, symptoms not improving   ED Prescriptions    Medication Sig Dispense Auth. Provider   albuterol (PROVENTIL HFA;VENTOLIN HFA) 108 (90 Base) MCG/ACT inhaler Inhale 1-2 puffs into the lungs every 6 (six) hours as needed for wheezing or shortness of breath. 1 Inhaler Kamika Goodloe C, PA-C   predniSONE (DELTASONE) 20 MG tablet Take 2 tablets (40 mg total) by mouth daily for 5 days. 10 tablet Annsleigh Dragoo C, PA-C   brompheniramine-pseudoephedrine-DM 30-2-10 MG/5ML syrup Take 5 mLs by mouth 4 (four) times daily as needed. 120 mL Johannes Everage C, PA-C     Controlled Substance Prescriptions [place] Controlled Substance Registry consulted? Not Applicable   Lew Dawes, [place] 02/01/18 1356

## 2019-09-19 ENCOUNTER — Other Ambulatory Visit: Payer: Self-pay

## 2019-09-19 ENCOUNTER — Encounter (HOSPITAL_COMMUNITY): Payer: Self-pay

## 2019-09-19 ENCOUNTER — Ambulatory Visit (HOSPITAL_COMMUNITY)
Admission: EM | Admit: 2019-09-19 | Discharge: 2019-09-19 | Disposition: A | Discharge status: Home / Self Care | Payer: Self-pay | Attending: Family Medicine | Admitting: Family Medicine

## 2019-09-19 DIAGNOSIS — H6593 Unspecified nonsuppurative otitis media, bilateral: Secondary | ICD-10-CM | POA: Insufficient documentation

## 2019-09-19 DIAGNOSIS — J0140 Acute pansinusitis, unspecified: Secondary | ICD-10-CM | POA: Insufficient documentation

## 2019-09-19 DIAGNOSIS — J029 Acute pharyngitis, unspecified: Secondary | ICD-10-CM | POA: Insufficient documentation

## 2019-09-19 DIAGNOSIS — Z885 Allergy status to narcotic agent status: Secondary | ICD-10-CM | POA: Insufficient documentation

## 2019-09-19 DIAGNOSIS — Z79899 Other long term (current) drug therapy: Secondary | ICD-10-CM | POA: Insufficient documentation

## 2019-09-19 DIAGNOSIS — Z20822 Contact with and (suspected) exposure to covid-19: Secondary | ICD-10-CM | POA: Insufficient documentation

## 2019-09-19 LAB — CBC WITH DIFFERENTIAL/PLATELET
Abs Immature Granulocytes: 0.03 10*3/uL (ref 0.00–0.07)
Basophils Absolute: 0.1 10*3/uL (ref 0.0–0.1)
Basophils Relative: 1 %
Eosinophils Absolute: 1.8 10*3/uL — ABNORMAL HIGH (ref 0.0–0.5)
Eosinophils Relative: 15 %
HCT: 41.5 % (ref 36.0–46.0)
Hemoglobin: 13.3 g/dL (ref 12.0–15.0)
Immature Granulocytes: 0 %
Lymphocytes Relative: 17 %
Lymphs Abs: 2 10*3/uL (ref 0.7–4.0)
MCH: 27.9 pg (ref 26.0–34.0)
MCHC: 32 g/dL (ref 30.0–36.0)
MCV: 87.2 fL (ref 80.0–100.0)
Monocytes Absolute: 1 10*3/uL (ref 0.1–1.0)
Monocytes Relative: 8 %
Neutro Abs: 7 10*3/uL (ref 1.7–7.7)
Neutrophils Relative %: 59 %
Platelets: 545 10*3/uL — ABNORMAL HIGH (ref 150–400)
RBC: 4.76 MIL/uL (ref 3.87–5.11)
RDW: 12.3 % (ref 11.5–15.5)
WBC: 12 10*3/uL — ABNORMAL HIGH (ref 4.0–10.5)
nRBC: 0 % (ref 0.0–0.2)

## 2019-09-19 MED ORDER — FLUTICASONE PROPIONATE 50 MCG/ACT NA SUSP: 1.0000 | Freq: Every day | NASAL | 2 refills | Status: DC

## 2019-09-19 MED ORDER — AMOXICILLIN-POT CLAVULANATE 875-125 MG PO TABS: 1.0000 | ORAL_TABLET | Freq: Two times a day (BID) | ORAL | 0 refills | Status: AC

## 2019-09-19 MED ORDER — CETIRIZINE HCL 10 MG PO TABS: 10.0000 mg | ORAL_TABLET | Freq: Every day | ORAL | 0 refills | Status: DC

## 2019-09-19 NOTE — Discharge Instructions (Addendum)
Take the Augmentin 2 times a day for 10 days. Begin using Flonase 1 spray in each nostril once a day Take 1 Zyrtec daily  Take tylenol 325mg  tablet x 2 up to every 6 hours for sore throat, fever and body aches. Do not exceed 8 tablets in 24 hours  I would like for you to consider following up with the ENT you have seen previously.  If symptoms are not improving or worsen over the next 1 week please return for reevaluation.  If your Covid-19 test is positive, you will receive a phone call from [facility] regarding your results. Negative test results are not called. Both positive and negative results area always visible on MyChart. If you do not have a MyChart account, sign up instructions are in your discharge papers.   Persons who are directed to care for themselves at home may discontinue isolation under the following conditions:   At least 10 days have passed since symptom onset and  At least 24 hours have passed without running a fever (this means without the use of fever-reducing medications) and  Other symptoms have improved.  Persons infected with COVID-19 who never develop symptoms may discontinue isolation and other precautions 10 days after the date of their first positive COVID-19 test.

## 2019-09-19 NOTE — ED Triage Notes (Signed)
Pt state she has  sore throat and she has ear pressure. X 4 days. Pt state she has some caught in her nasal passages.

## 2019-09-19 NOTE — ED Provider Notes (Signed)
[facility]    CSN: [id number] Arrival date & time: 09/19/19  1339      History   Chief Complaint Chief Complaint  Patient presents with  . Sore Throat  . Otalgia    HPI Mary Mccormick is a 64 y.o. female.   Patient presents to urgent care for complaint of sore throat, ear fullness, nasal congestion and sinus pain.  Sore throat began on Saturday and has been relatively stable.  She also reports ear fullness and sinus congestion worsening over the last 4 days.  She has felt very congested and feeling like there is something stuck in her nose.  She denies discharge and has been attempting to blow her nose continuously without anything coming out.  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  She reports this causes her to have poor control of food entering her sinus cavities from chewing.  She denies cough, shortness of breath, nausea, vomiting, diarrhea.  She does endorse generally feeling poorly with some body ache and chills.  She reports numerous home renovations recently that have generated large amounts of dust, mold exposure and other particulate matter.  She does have a history of asthma but does not report shortness of breath and feels this is well controlled despite recent exposures.  She does note that some of the symptoms did start after these home renovation changes.  She has tried over-the-counter pain medication.     Past Medical History:  Diagnosis Date  . Asthma   . Bipolar disorder (HCC) 05/10/2016  . Seasonal allergies   . 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  Was on Phenobarbital at one time--perhaps moved her to depakote    Patient Active Problem List   Diagnosis Date Noted  . Sinusitis 05/10/2016  . History of cocaine abuse (HCC) 05/10/2016  . Bipolar disorder (HCC) 05/10/2016  . Asthma   . Seasonal allergies   . Seizure [facility])     Past Surgical History:  Procedure Laterality Date  . APPENDECTOMY     . OOPHORECTOMY     Not clear which side--per patient removed subsequent to TAH for ovarian cancer  . TOTAL ABDOMINAL HYSTERECTOMY     With unilateral oophorectomy    OB History    Gravida  3   Para  2   Term  2   Preterm      AB  1   Living  2     SAB      TAB      Ectopic      Multiple      Live Births               Home Medications    Prior to Admission medications   Medication Sig Start Date End Date Taking? Authorizing Provider  albuterol (PROVENTIL HFA;VENTOLIN HFA) 108 (90 Base) MCG/ACT inhaler Inhale 1-2 puffs into the lungs every 6 (six) hours as needed for wheezing or shortness of breath. 01/31/18   Wieters, Hallie C, PA-C  amoxicillin-clavulanate (AUGMENTIN) 875-125 MG tablet Take 1 tablet by mouth every 12 (twelve) hours for 10 days. 09/19/19 09/29/19  Jalacia Mattila, Veryl Speak, PA-C  azithromycin (ZITHROMAX) 250 MG tablet Take 1 tablet (250 mg total) by mouth daily. Take first 2 tablets together, then 1 every day until finished. 08/10/17   Mardella Layman, MD  benzonatate (TESSALON) 100 MG capsule Take 1 capsule (100 mg total) by mouth every 8 (eight) hours.  08/10/17   Mardella Layman, MD  beta carotene w/minerals (OCUVITE) tablet Take 1 tablet by mouth daily.    [provider]  brompheniramine-pseudoephedrine-DM 30-2-10 MG/5ML syrup Take 5 mLs by mouth 4 (four) times daily as needed. 01/31/18   Wieters, Hallie C, PA-C  cetirizine (ZYRTEC ALLERGY) 10 MG tablet Take 1 tablet (10 mg total) by mouth daily. 09/19/19   Guiselle Mian, Veryl Speak, PA-C  divalproex (DEPAKOTE) 500 MG DR tablet Take 500 mg by mouth 2 (two) times daily.    [provider]  fluticasone (FLONASE) 50 MCG/ACT nasal spray Place 1 spray into both nostrils daily. 09/19/19   Raysa Bosak, Veryl Speak, PA-C  ibuprofen (ADVIL,MOTRIN) 200 MG tablet Take 600 mg by mouth every 6 (six) hours as needed for moderate pain.    [provider]  loratadine (CLARITIN) 10 MG tablet Take 10 mg by mouth daily.    [provider]  Multiple Vitamin (MULTIVITAMIN WITH MINERALS) TABS tablet Take 1 tablet by mouth daily.    [provider]  vitamin B-12 (CYANOCOBALAMIN) 500 MCG tablet Take 500 mcg by mouth daily.    [provider]    Family History Family History  Problem Relation Age of Onset  . Hypertension Mother   . Thyroid disease Mother   . Diabetes Father   . Hypertension Father   . Heart disease Father   . Cancer Maternal Grandfather        colon  . Cancer Maternal Aunt        thyroid  . Cancer Maternal Uncle        thyroid  . Hypertension Paternal Uncle   . Asthma Brother     Social History Social History   Tobacco Use  . Smoking status: Never Smoker  . Smokeless tobacco: Never Used  Substance Use Topics  . Alcohol use: Yes    Alcohol/week: 1.0 standard drinks    Types: 1 Shots of liquor per week    Comment: occasional  . Drug use: No    Comment: History of cocaine abuse     Allergies   Codeine and Tagamet [cimetidine]   Review of Systems Review of Systems  Constitutional: Positive for activity change and fatigue. Negative for chills and fever.  HENT: Positive for congestion, ear pain, sinus pressure, sinus pain and sore throat. Negative for ear discharge, facial swelling, hearing loss, nosebleeds, postnasal drip and rhinorrhea.   Eyes: Negative for pain and visual disturbance.  Respiratory: Negative for cough, shortness of breath and wheezing.   Cardiovascular: Negative for chest pain and palpitations.  Gastrointestinal: Negative for abdominal pain, diarrhea, nausea and vomiting.  Genitourinary: Negative for dysuria and hematuria.  Musculoskeletal: Positive for myalgias. Negative for arthralgias and back pain.  Skin: Negative for color change and rash.  Neurological: Negative for seizures, syncope and headaches.  All other systems reviewed and are negative.    Physical Exam Triage Vital Signs ED Triage Vitals  Enc Vitals Group     BP       Pulse      Resp      Temp      Temp src      SpO2      Weight      Height      Head Circumference      Peak Flow      Pain Score      Pain Loc      Pain Edu?      Excl. in GC?  No data found.  Updated Vital Signs BP (!) 145/82 (BP Location: Right Arm)   Pulse 93   Temp 99.3 F (37.4 C) (Oral)   Resp 16   Wt 130 lb (59 kg)   SpO2 100%   BMI 25.18 kg/m   Visual Acuity Right Eye Distance:   Left Eye Distance:   Bilateral Distance:    Right Eye Near:   Left Eye Near:    Bilateral Near:     Physical Exam Vitals and nursing note reviewed.  Constitutional:      General: She is not in acute distress.    Appearance: She is well-developed. She is ill-appearing.  HENT:     Head: Normocephalic and atraumatic.     Right Ear: No tenderness. A middle ear effusion is present. Tympanic membrane is not erythematous.     Left Ear: No tenderness. A middle ear effusion is present. Tympanic membrane is not erythematous.     Nose:     Comments: Septum is not present.  Erythematous mucosa throughout with exudates present.  Tissue appears friable as well.  Bilateral frontal and maxillary sinus tenderness    Mouth/Throat:     Mouth: Mucous membranes are moist. No oral lesions.     Pharynx: Oropharynx is clear. No posterior oropharyngeal erythema.     Tonsils: No tonsillar exudate or tonsillar abscesses. 2+ on the right. 2+ on the left.  Eyes:     Conjunctiva/sclera: Conjunctivae normal.     Pupils: Pupils are equal, round, and reactive to light.  Cardiovascular:     Rate and Rhythm: Normal rate and regular rhythm.     Heart sounds: No murmur. No friction rub. No gallop.   Pulmonary:     Effort: Pulmonary effort is normal. No respiratory distress.     Breath sounds: Normal breath sounds. No wheezing, rhonchi or rales.  Musculoskeletal:     Cervical back: Neck supple.  Skin:    General: Skin is warm and dry.  Neurological:     General: No focal deficit present.     Mental  Status: She is alert and oriented to person, place, and time.  Psychiatric:        Mood and Affect: Mood normal.        Behavior: Behavior normal.      UC Treatments / Results  Labs (all labs ordered are listed, but only abnormal results are displayed) Labs Reviewed  NOVEL CORONAVIRUS, NAA (HOSP ORDER, SEND-OUT TO REF LAB; TAT 18-24 HRS)  CBC WITH DIFFERENTIAL/PLATELET    EKG   Radiology No results found.  Procedures Procedures (including critical care time)  Medications Ordered in UC Medications - No data to display  Initial Impression / Assessment and Plan / UC Course  I have reviewed the triage vital signs and the nursing notes.  Pertinent labs & imaging results that were available during my care of the patient were reviewed by me and considered in my medical decision making (see chart for details).     #Sinusitis #Bilateral serous otitis media Patient is a 64 year old female with past medical history of recurrent sinusitis, septal deformity secondary to chronic cocaine abuse who presents with sinus congestion and pain.  Given she does not have a septum her sinuses are much more susceptible to infection currently.  She reports she has not followed up with an ENT recently.  As well she does not have primary care.  Currently she is afebrile. -We will treat this with Augmentin as a  sinusitis, and give Flonase for inflammatory relief in nasal passage -Zyrtec recommended as well -Tylenol for pain -Covid PCR sent with precautions discussed -Recommended following up with her ENT to discuss further management of her chronic sinusitis and discuss preventive measures. -CBC with differential was drawn to evaluate for eosinophilia  Final Clinical Impressions(s) / UC Diagnoses   Final diagnoses:  Acute pansinusitis, recurrence not specified  Bilateral serous otitis media, unspecified chronicity     Discharge Instructions     Take the Augmentin 2 times a day for 10  days. Begin using Flonase 1 spray in each nostril once a day Take 1 Zyrtec daily  Take tylenol 325mg  tablet x 2 up to every 6 hours for sore throat, fever and body aches. Do not exceed 8 tablets in 24 hours  I would like for you to consider following up with the ENT you have seen previously.  If symptoms are not improving or worsen over the next 1 week please return for reevaluation.  If your Covid-19 test is positive, you will receive a phone call from [facility] regarding your results. Negative test results are not called. Both positive and negative results area always visible on MyChart. If you do not have a MyChart account, sign up instructions are in your discharge papers.   Persons who are directed to care for themselves at home may discontinue isolation under the following conditions:  . At least 10 days have passed since symptom onset and . At least 24 hours have passed without running a fever (this means without the use of fever-reducing medications) and . Other symptoms have improved.  Persons infected with COVID-19 who never develop symptoms may discontinue isolation and other precautions 10 days after the date of their first positive COVID-19 test.      ED Prescriptions    Medication Sig Dispense Auth. Provider   amoxicillin-clavulanate (AUGMENTIN) 875-125 MG tablet Take 1 tablet by mouth every 12 (twelve) hours for 10 days. 20 tablet Aashish Hamm, [facility], PA-C   fluticasone (FLONASE) 50 MCG/ACT nasal spray Place 1 spray into both nostrils daily. 11.1 mL Finnbar Cedillos, Veryl Speak, PA-C   cetirizine (ZYRTEC ALLERGY) 10 MG tablet Take 1 tablet (10 mg total) by mouth daily. 30 tablet Ishana Blades, Veryl Speak, PA-C     PDMP not reviewed this encounter.   Veryl Speak, PA-C 09/19/19 [phone]

## 2019-09-21 LAB — NOVEL CORONAVIRUS, NAA (HOSP ORDER, SEND-OUT TO REF LAB; TAT 18-24 HRS): SARS-CoV-2, NAA: NOT DETECTED

## 2019-10-06 ENCOUNTER — Encounter (HOSPITAL_COMMUNITY): Payer: Self-pay

## 2019-10-06 ENCOUNTER — Emergency Department (HOSPITAL_COMMUNITY)
Admission: EM | Admit: 2019-10-06 | Discharge: 2019-10-07 | Disposition: A | Discharge status: Home / Self Care | Payer: Self-pay | Attending: Emergency Medicine | Admitting: Emergency Medicine

## 2019-10-06 DIAGNOSIS — R1084 Generalized abdominal pain: Secondary | ICD-10-CM

## 2019-10-06 DIAGNOSIS — J0191 Acute recurrent sinusitis, unspecified: Secondary | ICD-10-CM

## 2019-10-06 DIAGNOSIS — J45909 Unspecified asthma, uncomplicated: Secondary | ICD-10-CM | POA: Insufficient documentation

## 2019-10-06 DIAGNOSIS — Z79899 Other long term (current) drug therapy: Secondary | ICD-10-CM | POA: Insufficient documentation

## 2019-10-06 LAB — CBC WITH DIFFERENTIAL/PLATELET
Abs Immature Granulocytes: 0.03 10*3/uL (ref 0.00–0.07)
Basophils Absolute: 0.1 10*3/uL (ref 0.0–0.1)
Basophils Relative: 1 %
Eosinophils Absolute: 0.6 10*3/uL — ABNORMAL HIGH (ref 0.0–0.5)
Eosinophils Relative: 5 %
HCT: 36.9 % (ref 36.0–46.0)
Hemoglobin: 11.8 g/dL — ABNORMAL LOW (ref 12.0–15.0)
Immature Granulocytes: 0 %
Lymphocytes Relative: 12 %
Lymphs Abs: 1.4 10*3/uL (ref 0.7–4.0)
MCH: 28.3 pg (ref 26.0–34.0)
MCHC: 32 g/dL (ref 30.0–36.0)
MCV: 88.5 fL (ref 80.0–100.0)
Monocytes Absolute: 0.8 10*3/uL (ref 0.1–1.0)
Monocytes Relative: 7 %
Neutro Abs: 8.2 10*3/uL — ABNORMAL HIGH (ref 1.7–7.7)
Neutrophils Relative %: 75 %
Platelets: 617 10*3/uL — ABNORMAL HIGH (ref 150–400)
RBC: 4.17 MIL/uL (ref 3.87–5.11)
RDW: 12.7 % (ref 11.5–15.5)
WBC: 11 10*3/uL — ABNORMAL HIGH (ref 4.0–10.5)
nRBC: 0 % (ref 0.0–0.2)

## 2019-10-06 MED ORDER — SODIUM CHLORIDE 0.9 % IV BOLUS
1000.0000 mL | Freq: Once | INTRAVENOUS | Status: AC
  Administered 2019-10-06: 1000 mL via INTRAVENOUS

## 2019-10-06 MED ORDER — MORPHINE SULFATE (PF) 4 MG/ML IV SOLN
4.0000 mg | Freq: Once | INTRAVENOUS | Status: AC
  Administered 2019-10-06: 4 mg via INTRAVENOUS
  Filled 2019-10-06: qty 1

## 2019-10-06 MED ORDER — ONDANSETRON HCL 4 MG/2ML IJ SOLN
4.0000 mg | Freq: Once | INTRAMUSCULAR | Status: AC
  Administered 2019-10-06: 4 mg via INTRAVENOUS
  Filled 2019-10-06: qty 2

## 2019-10-06 NOTE — ED Provider Notes (Signed)
[facility]-EMERGENCY DEPT Provider Note   CSN: [id number] Arrival date & time: 10/06/19  2139     History No chief complaint on file.   Mary Mccormick is a 64 y.o. female.  Patient presents to the emergency department with multiple complaints.  She reports that she has been doing some construction on her home, painting and sanding as well as putting up drywall.  She has noticed increased nasal congestion and drainage and now is experiencing right-sided facial pain.  Patient also experiencing abdominal pain that began earlier today.  Pain is diffuse and is accompanied by nausea and vomiting.  She has not been able to eat today because of the symptoms.        Past Medical History:  Diagnosis Date  . Asthma   . Bipolar disorder (HCC) 05/10/2016  . Seasonal allergies   . 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  Was on Phenobarbital at one time--perhaps moved her to depakote    Patient Active Problem List   Diagnosis Date Noted  . Sinusitis 05/10/2016  . History of cocaine abuse (HCC) 05/10/2016  . Bipolar disorder (HCC) 05/10/2016  . Asthma   . Seasonal allergies   . Seizure [facility])     Past Surgical History:  Procedure Laterality Date  . APPENDECTOMY    . OOPHORECTOMY     Not clear which side--per patient removed subsequent to TAH for ovarian cancer  . TOTAL ABDOMINAL HYSTERECTOMY     With unilateral oophorectomy     OB History    Gravida  3   Para  2   Term  2   Preterm      AB  1   Living  2     SAB      TAB      Ectopic      Multiple      Live Births              Family History  Problem Relation Age of Onset  . Hypertension Mother   . Thyroid disease Mother   . Diabetes Father   . Hypertension Father   . Heart disease Father   . Cancer Maternal Grandfather        colon  . Cancer Maternal Aunt        thyroid  . Cancer Maternal Uncle        thyroid  . Hypertension Paternal Uncle   . Asthma Brother      Social History   Tobacco Use  . Smoking status: Never Smoker  . Smokeless tobacco: Never Used  Substance Use Topics  . Alcohol use: Yes    Alcohol/week: 1.0 standard drinks    Types: 1 Shots of liquor per week    Comment: occasional  . Drug use: No    Comment: History of cocaine abuse    Home Medications Prior to Admission medications   Medication Sig Start Date End Date Taking? Authorizing Provider  acetaminophen (TYLENOL) 500 MG tablet Take 1,000 mg by mouth every 6 (six) hours as needed for mild pain.   Yes [provider]  albuterol (PROVENTIL HFA;VENTOLIN HFA) 108 (90 Base) MCG/ACT inhaler Inhale 1-2 puffs into the lungs every 6 (six) hours as needed for wheezing or shortness of breath. 01/31/18  Yes Wieters, Hallie C, PA-C  ibuprofen (ADVIL,MOTRIN) 200 MG tablet Take 600 mg by mouth every 6 (six) hours as needed for moderate pain.   Yes [provider]  loratadine (CLARITIN) 10 MG tablet Take 10 mg by mouth daily.   Yes [provider]  Multiple Vitamin (MULTIVITAMIN WITH MINERALS) TABS tablet Take 1 tablet by mouth daily.   Yes [provider]  azithromycin (ZITHROMAX) 250 MG tablet Take 1 tablet (250 mg total) by mouth daily. Take first 2 tablets together, then 1 every day until finished. 10/07/19   Gilda Crease, MD  fexofenadine-pseudoephedrine (ALLEGRA-D) 60-120 MG 12 hr tablet Take 1 tablet by mouth every 12 (twelve) hours. 10/07/19   Gilda Crease, MD  triamcinolone (NASACORT) 55 MCG/ACT AERO nasal inhaler Place 2 sprays into the nose daily. 10/07/19   Gilda Crease, MD  cetirizine (ZYRTEC ALLERGY) 10 MG tablet Take 1 tablet (10 mg total) by mouth daily. Patient not taking: Reported on 10/06/2019 09/19/19 10/07/19  Darr, Veryl Speak, PA-C  fluticasone [facility]) 50 MCG/ACT nasal spray Place 1 spray into both nostrils daily. Patient not taking: Reported on 10/06/2019 09/19/19 10/07/19  Darr, Veryl Speak, PA-C    Allergies     Codeine and Tagamet [cimetidine]  Review of Systems   Review of Systems  HENT: Positive for congestion.   Gastrointestinal: Positive for abdominal pain, nausea and vomiting.  All other systems reviewed and are negative.   Physical Exam Updated Vital Signs BP (!) 168/82   Pulse 87   Temp 98.2 F (36.8 C) (Oral)   Resp 20   SpO2 98%   Physical Exam Vitals and nursing note reviewed.  Constitutional:      General: She is not in acute distress.    Appearance: Normal appearance. She is well-developed.  HENT:     Head: Normocephalic and atraumatic.     Right Ear: Hearing normal.     Left Ear: Hearing normal.     Nose: Nose normal.  Eyes:     Conjunctiva/sclera: Conjunctivae normal.     Pupils: Pupils are equal, round, and reactive to light.  Cardiovascular:     Rate and Rhythm: Regular rhythm.     Heart sounds: S1 normal and S2 normal. No murmur. No friction rub. No gallop.   Pulmonary:     Effort: Pulmonary effort is normal. No respiratory distress.     Breath sounds: Normal breath sounds.  Chest:     Chest wall: No tenderness.  Abdominal:     General: Bowel sounds are normal.     Palpations: Abdomen is soft.     Tenderness: There is generalized abdominal tenderness. There is no guarding or rebound. Negative signs include Murphy's sign and McBurney's sign.     Hernia: No hernia is present.  Musculoskeletal:        General: Normal range of motion.     Cervical back: Normal range of motion and neck supple.  Skin:    General: Skin is warm and dry.     Findings: No rash.  Neurological:     Mental Status: She is alert and oriented to person, place, and time.     GCS: GCS eye subscore is 4. GCS verbal subscore is 5. GCS motor subscore is 6.     Cranial Nerves: No cranial nerve deficit.     Sensory: No sensory deficit.     Coordination: Coordination normal.  Psychiatric:        Speech: Speech normal.        Behavior: Behavior normal.        Thought Content: Thought  content normal.     ED Results / Procedures /  Treatments   Labs (all labs ordered are listed, but only abnormal results are displayed) Labs Reviewed  CBC WITH DIFFERENTIAL/PLATELET - Abnormal; Notable for the following components:      Result Value   WBC 11.0 (*)    Hemoglobin 11.8 (*)    Platelets 617 (*)    Neutro Abs 8.2 (*)    Eosinophils Absolute 0.6 (*)    All other components within normal limits  COMPREHENSIVE METABOLIC PANEL - Abnormal; Notable for the following components:   Potassium 3.3 (*)    Glucose, Bld 114 (*)    AST 67 (*)    ALT 57 (*)    All other components within normal limits  URINALYSIS, ROUTINE W REFLEX MICROSCOPIC - Abnormal; Notable for the following components:   Hgb urine dipstick SMALL (*)    Leukocytes,Ua SMALL (*)    All other components within normal limits  LIPASE, BLOOD  TROPONIN I (HIGH SENSITIVITY)  TROPONIN I (HIGH SENSITIVITY)    EKG EKG Interpretation  Date/Time:  Sunday October 06 2019 23:41:36 EST Ventricular Rate:  81 PR Interval:    QRS Duration: 82 QT Interval:  402 QTC Calculation: 467 R Axis:   67 Text Interpretation: Sinus rhythm Probable left atrial enlargement RSR' in V1 or V2, probably normal variant Probable left ventricular hypertrophy Abnrm T, consider ischemia, anterolateral lds No significant change since last tracing Confirmed by Gilda Crease [phone]) on 10/07/2019 12:23:41 AM   Radiology CT ABDOMEN PELVIS W CONTRAST  Result Date: 10/07/2019 CLINICAL DATA:  Abdominal distention with nausea and vomiting. EXAM: CT ABDOMEN AND PELVIS WITH CONTRAST TECHNIQUE: Multidetector CT imaging of the abdomen and pelvis was performed using the standard protocol following bolus administration of intravenous contrast. CONTRAST:  OMNIPAQUE IOHEXOL 300 MG/ML  SOLN COMPARISON:  09/18/2013 FINDINGS: Lower chest:  No contributory findings. Hepatobiliary: No focal liver abnormality.No evidence of biliary obstruction or stone.  Pancreas: Unremarkable. Spleen: Unremarkable. Adrenals/Urinary Tract: Negative adrenals. No hydronephrosis or stone. Mild cortical scarring at the upper pole left kidney. Unremarkable bladder. Stomach/Bowel: No obstruction. Appendectomy. No evidence of bowel inflammation. Sigmoid diverticulosis. Vascular/Lymphatic: No acute vascular abnormality. No mass or adenopathy. Reproductive:Hysterectomy. Other: No ascites or pneumoperitoneum. Musculoskeletal: No acute abnormalities. IMPRESSION: 1. No acute finding or explanation for symptoms. 2. Sigmoid diverticulosis. Electronically Signed   By: Marnee Spring M.D.   On: 10/07/2019 05:27   CT Maxillofacial W Contrast  Result Date: 10/07/2019 CLINICAL DATA:  Right-sided face pain from nose to chin. History of face surgery many years ago EXAM: CT MAXILLOFACIAL WITH CONTRAST TECHNIQUE: Multidetector CT imaging of the maxillofacial structures was performed with intravenous contrast. Multiplanar CT image reconstructions were also generated. CONTRAST:  OMNIPAQUE IOHEXOL 300 MG/ML  SOLN COMPARISON:  Brain MRI 12/20/2019 FINDINGS: Osseous: No evidence of fracture or aggressive lesion. There is cervical facet spurring which is partially covered. Orbits: No evidence of mass or inflammation. Sinuses: Patchy mucosal thickening throughout the sinuses. There is thickening and increased enhancement within the nasal cavity which shows large septal defect and bilateral turbinate resection or erosion. The appearance is stable from 2017 and reportedly there is history of nasal surgery, although findings are extensive and there could be a superimposed inflammatory/granulomatous process. No discrete mass or evidence of invasive sinus disease in periantral soft tissues. Soft tissues: No evidence of soft tissue inflammation to suggest invasive sinus disease. No soft tissue mass is seen. Bilateral lateral retropharyngeal lymph node enlargement and jugular chain enlargement which was also  partially covered  on 2017 brain MRI. This may be reactive to the patient's right of sinusitis. The right lateral retropharyngeal lymph node measures up to 7 mm in thickness. Limited intracranial: Negative IMPRESSION: 1. Chronic rhinosinusitis with extensive septal defect and bilateral turbinate erosion or resection. Given the extent of findings consider chronic granulomatous disease. 2. Upper cervical lymphadenopathy also seen in 2017, presumably reactive. 3. No acute finding. Electronically Signed   By: Monte Fantasia M.D.   On: 10/07/2019 05:37    Procedures Procedures (including critical care time)  Medications Ordered in ED Medications  sodium chloride (PF) 0.9 % injection (has no administration in time range)  sodium chloride 0.9 % bolus 1,000 mL (0 mLs Intravenous Stopped 10/07/19 0117)  morphine 4 MG/ML injection 4 mg (4 mg Intravenous Given 10/06/19 2336)  ondansetron (ZOFRAN) injection 4 mg (4 mg Intravenous Given 10/06/19 2334)  iohexol (OMNIPAQUE) 300 MG/ML solution 100 mL (100 mLs Intravenous Contrast Given 10/07/19 0433)    ED Course  I have reviewed the triage vital signs and the nursing notes.  Pertinent labs & imaging results that were available during my care of the patient were reviewed by me and considered in my medical decision making (see chart for details).    MDM Rules/Calculators/A&P                      Patient presents to the emergency department for evaluation of multiple complaints.  Complaining of facial pain of unclear etiology.  She reports previous surgery years ago.  She has not had any injury.  She does report increased nasal congestion and intermittent drainage that has been ongoing for weeks.  She thinks this is secondary to exposure to multiple chemicals and dust while doing some construction on her house.  There is no overlying rash to suggest shingles.  She does not have any facial asymmetry.  There are no stroke symptoms.  She has not experiencing any  associated headache.  CT maxillofacial bones with IV contrast was performed.  No acute findings other than diffuse sinus disease.  Will treat empirically with antibiotics.  Follow-up with ENT.  Patient continuing to complain of abdominal pain.  Lab work was unremarkable.  CT abdomen and pelvis did not show any acute pathology.  She was not experiencing any chest pain but cardiac work-up performed to ensure that her nausea and abdominal discomfort was not cardiac in origin.  Results were unrevealing.  Patient will be discharged, follow-up as outpatient with primary care.    Final Clinical Impression(s) / ED Diagnoses Final diagnoses:  Acute recurrent sinusitis, unspecified location  Generalized abdominal pain    Rx / DC Orders ED Discharge Orders         Ordered    triamcinolone (NASACORT) 55 MCG/ACT AERO nasal inhaler  Daily     10/07/19 0635    fexofenadine-pseudoephedrine (ALLEGRA-D) 60-120 MG 12 hr tablet  Every 12 hours     10/07/19 0635    azithromycin (ZITHROMAX) 250 MG tablet  Daily     10/07/19 0635           Orpah Greek, MD 10/07/19 [phone]

## 2019-10-06 NOTE — ED Triage Notes (Addendum)
BIB EMS from home. Pt reports right sided face pain from nose around to chin. PT reports Facial surgery "many years ago". PT also reports Nausea and vomiting and unable to eat. Pain started 2 days ago.  PT denies double vision, blurred vision or dizziness.    4 of Zofran given by EMS IV L wrist.

## 2019-10-07 ENCOUNTER — Emergency Department (HOSPITAL_COMMUNITY): Payer: Self-pay

## 2019-10-07 ENCOUNTER — Encounter (HOSPITAL_COMMUNITY): Payer: Self-pay

## 2019-10-07 LAB — COMPREHENSIVE METABOLIC PANEL
ALT: 57 U/L — ABNORMAL HIGH (ref 0–44)
AST: 67 U/L — ABNORMAL HIGH (ref 15–41)
Albumin: 3.5 g/dL (ref 3.5–5.0)
Alkaline Phosphatase: 115 U/L (ref 38–126)
Anion gap: 12 (ref 5–15)
BUN: 12 mg/dL (ref 8–23)
CO2: 23 mmol/L (ref 22–32)
Calcium: 9.4 mg/dL (ref 8.9–10.3)
Chloride: 104 mmol/L (ref 98–111)
Creatinine, Ser: 0.61 mg/dL (ref 0.44–1.00)
GFR calc Af Amer: >60 mL/min (ref >60)
GFR calc non Af Amer: >60 mL/min (ref >60)
Glucose, Bld: 114 mg/dL — ABNORMAL HIGH (ref 70–99)
Potassium: 3.3 mmol/L — ABNORMAL LOW (ref 3.5–5.1)
Sodium: 139 mmol/L (ref 135–145)
Total Bilirubin: 0.6 mg/dL (ref 0.3–1.2)
Total Protein: 6.6 g/dL (ref 6.5–8.1)

## 2019-10-07 LAB — URINALYSIS, ROUTINE W REFLEX MICROSCOPIC
Bacteria, UA: NONE SEEN
Bilirubin Urine: NEGATIVE
Glucose, UA: NEGATIVE mg/dL
Ketones, ur: NEGATIVE mg/dL
Nitrite: NEGATIVE
Protein, ur: NEGATIVE mg/dL
Specific Gravity, Urine: 1.025 (ref 1.005–1.030)
pH: 5 (ref 5.0–8.0)

## 2019-10-07 LAB — TROPONIN I (HIGH SENSITIVITY)
Troponin I (High Sensitivity): 6 ng/L (ref <18)
Troponin I (High Sensitivity): 8 ng/L (ref <18)

## 2019-10-07 LAB — LIPASE, BLOOD: Lipase: 21 U/L (ref 11–51)

## 2019-10-07 MED ORDER — AZITHROMYCIN 250 MG PO TABS: 250.0000 mg | ORAL_TABLET | Freq: Every day | ORAL | 0 refills | Status: DC

## 2019-10-07 MED ORDER — FEXOFENADINE-PSEUDOEPHED ER 60-120 MG PO TB12: 1.0000 | ORAL_TABLET | Freq: Two times a day (BID) | ORAL | 0 refills | Status: DC

## 2019-10-07 MED ORDER — OXYCODONE-ACETAMINOPHEN 5-325 MG PO TABS
1.0000 | ORAL_TABLET | Freq: Once | ORAL | Status: AC
  Administered 2019-10-07: 1 via ORAL
  Filled 2019-10-07: qty 1

## 2019-10-07 MED ORDER — SODIUM CHLORIDE (PF) 0.9 % IJ SOLN
INTRAMUSCULAR | Status: AC
  Filled 2019-10-07: qty 50

## 2019-10-07 MED ORDER — IOHEXOL 300 MG/ML  SOLN
100.0000 mL | Freq: Once | INTRAMUSCULAR | Status: AC | PRN
  Administered 2019-10-07: 100 mL via INTRAVENOUS

## 2019-10-07 MED ORDER — TRIAMCINOLONE ACETONIDE 55 MCG/ACT NA AERO: 2.0000 | INHALATION_SPRAY | Freq: Every day | NASAL | 12 refills | Status: DC

## 2019-10-07 NOTE — ED Notes (Signed)
PT transported to CT>

## 2019-10-07 NOTE — ED Notes (Signed)
PT alert and ambulatory at discharge. PT verbalized understanding of discharge instructions. PT denies any questions.

## 2019-10-18 ENCOUNTER — Ambulatory Visit (INDEPENDENT_AMBULATORY_CARE_PROVIDER_SITE_OTHER): Payer: Self-pay | Admitting: Otolaryngology

## 2019-10-18 ENCOUNTER — Encounter (INDEPENDENT_AMBULATORY_CARE_PROVIDER_SITE_OTHER): Payer: Self-pay | Admitting: Otolaryngology

## 2019-10-18 ENCOUNTER — Other Ambulatory Visit: Payer: Self-pay

## 2019-10-18 VITALS — Temp 97.9°F

## 2019-10-18 DIAGNOSIS — J324 Chronic pansinusitis: Secondary | ICD-10-CM

## 2019-10-18 DIAGNOSIS — J3489 Other specified disorders of nose and nasal sinuses: Secondary | ICD-10-CM

## 2019-10-18 NOTE — Progress Notes (Addendum)
HPI: Mary Mccormick is a 64 y.o. female who presents for evaluation of chronic nasal sinus symptoms.  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  She has been having a lot of pain mostly on the right side of her face and was seen at the ED where she had a CT scan of her sinuses that showed chronic sinus disease as well as a large septal perforation.  She was treated with Augmentin for 10 days.  I had seen her previously about 15 years ago.  Past Medical History:  Diagnosis Date  . Asthma   . Bipolar disorder (HCC) 05/10/2016  . Seasonal allergies   . 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  Was on Phenobarbital at one time--perhaps moved her to depakote   Past Surgical History:  Procedure Laterality Date  . APPENDECTOMY    . OOPHORECTOMY     Not clear which side--per patient removed subsequent to TAH for ovarian cancer  . TOTAL ABDOMINAL HYSTERECTOMY     With unilateral oophorectomy   Social History   Socioeconomic History  . Marital status: Divorced    Spouse name: Not on file  . Number of children: 2  . Years of education: college  . Highest education level: Not on file  Occupational History  . Occupation: unemployed  Tobacco Use  . Smoking status: Never Smoker  . Smokeless tobacco: Never Used  Substance and Sexual Activity  . Alcohol use: Yes    Alcohol/week: 1.0 standard drinks    Types: 1 Shots of liquor per week    Comment: occasional  . Drug use: No    Comment: History of cocaine abuse  . Sexual activity: Not Currently    Birth control/protection: None, Surgical  Other Topics Concern  . Not on file  Social History Narrative   Lives with current husband, [name].   He is employed   Chemical engineer Strain:   . Difficulty of Paying Living Expenses:   Food Insecurity:   . Worried About Programme researcher, broadcasting/film/video in the Last Year:   . Barista in the Last Year:    Transportation Needs:   . Freight forwarder (Medical):   Marland Kitchen Lack of Transportation (Non-Medical):   Physical Activity:   . Days of Exercise per Week:   . Minutes of Exercise per Session:   Stress:   . Feeling of Stress :   Social Connections:   . Frequency of Communication with Friends and Family:   . Frequency of Social Gatherings with Friends and Family:   . Attends Religious Services:   . Active Member of Clubs or Organizations:   . Attends Banker Meetings:   Marland Kitchen Marital Status:    Family History  Problem Relation Age of Onset  . Hypertension Mother   . Thyroid disease Mother   . Diabetes Father   . Hypertension Father   . Heart disease Father   . Cancer Maternal Grandfather        colon  . Cancer Maternal Aunt        thyroid  . Cancer Maternal Uncle        thyroid  . Hypertension Paternal Uncle   . Asthma Brother    Allergies  Allergen Reactions  . Codeine Nausea And Vomiting  . Tagamet [Cimetidine] Itching and Rash   Prior to Admission medications   Medication Sig  Start Date End Date Taking? Authorizing Provider  acetaminophen (TYLENOL) 500 MG tablet Take 1,000 mg by mouth every 6 (six) hours as needed for mild pain.   Yes [provider]  albuterol (PROVENTIL HFA;VENTOLIN HFA) 108 (90 Base) MCG/ACT inhaler Inhale 1-2 puffs into the lungs every 6 (six) hours as needed for wheezing or shortness of breath. 01/31/18  Yes Wieters, Hallie C, PA-C  azithromycin (ZITHROMAX) 250 MG tablet Take 1 tablet (250 mg total) by mouth daily. Take first 2 tablets together, then 1 every day until finished. 10/07/19  Yes Pollina, Gwenyth Allegra, MD  fexofenadine-pseudoephedrine (ALLEGRA-D) 60-120 MG 12 hr tablet Take 1 tablet by mouth every 12 (twelve) hours. 10/07/19  Yes Pollina, Gwenyth Allegra, MD  ibuprofen (ADVIL,MOTRIN) 200 MG tablet Take 600 mg by mouth every 6 (six) hours as needed for moderate pain.   Yes [provider]  loratadine (CLARITIN) 10 MG  tablet Take 10 mg by mouth daily.   Yes [provider]  Multiple Vitamin (MULTIVITAMIN WITH MINERALS) TABS tablet Take 1 tablet by mouth daily.   Yes [provider]  triamcinolone (NASACORT) 55 MCG/ACT AERO nasal inhaler Place 2 sprays into the nose daily. 10/07/19  Yes Pollina, Gwenyth Allegra, MD  cetirizine (ZYRTEC ALLERGY) 10 MG tablet Take 1 tablet (10 mg total) by mouth daily. Patient not taking: Reported on 10/06/2019 09/19/19 10/07/19  Darr, Marguerita Beards, PA-C  fluticasone [facility]) 50 MCG/ACT nasal spray Place 1 spray into both nostrils daily. Patient not taking: Reported on 10/06/2019 09/19/19 10/07/19  Darr, Marguerita Beards, PA-C     Positive ROS: Otherwise negative  All other systems have been reviewed and were otherwise negative with the exception of those mentioned in the HPI and as above.  Physical Exam: Constitutional: Alert, well-appearing, no acute distress Ears: External ears without lesions or tenderness. Ear canals are clear bilaterally with intact, clear TMs bilaterally. Nasal: External nose without lesions.  She has a very large almost total septal perforation with a large amount of scabbing crusting within the nasal cavity occluding both middle meatus regions.  I was able to remove some of the crusting in the office today but caused significant discomfort. Oral: Lips and gums without lesions. Tongue and palate mucosa without lesions. Posterior oropharynx clear. Neck: No palpable adenopathy or masses Respiratory: Breathing comfortably  Skin: No facial/neck lesions or rash noted.  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  Plan: Recommended regular use of nasal saline irrigations twice a day with use of mupirocin 2% ointment within the saline irrigations.  Prescribed mupirocin 2% 22 g tube and recommended use of approximately 500 mg mixed in with the saline rinse to be used daily. Also  prescribed Ceftin 500 mg twice daily for 2 weeks. She will follow-up in 3 to 4 weeks for recheck.  Radene Journey, MD

## 2019-11-15 ENCOUNTER — Ambulatory Visit (INDEPENDENT_AMBULATORY_CARE_PROVIDER_SITE_OTHER): Payer: PRIVATE HEALTH INSURANCE | Admitting: Otolaryngology

## 2019-11-15 ENCOUNTER — Encounter (INDEPENDENT_AMBULATORY_CARE_PROVIDER_SITE_OTHER): Payer: Self-pay | Admitting: Otolaryngology

## 2019-11-15 ENCOUNTER — Other Ambulatory Visit: Payer: Self-pay

## 2019-11-15 VITALS — Temp 97.2°F

## 2019-11-15 DIAGNOSIS — J310 Chronic rhinitis: Secondary | ICD-10-CM | POA: Diagnosis not present

## 2019-11-15 DIAGNOSIS — J3489 Other specified disorders of nose and nasal sinuses: Secondary | ICD-10-CM

## 2019-11-15 DIAGNOSIS — J324 Chronic pansinusitis: Secondary | ICD-10-CM

## 2019-11-15 NOTE — Progress Notes (Signed)
HPI: Mary Mccormick is a 64 y.o. female who returns today for evaluation of chronic nasal sinus issues.  She still cannot breathe through her nose and describes a lot of pain pressure and swelling in the nose and face more so on the left side.  She had previous surgery by myself in 2003 where she had direct laryngoscopy and biopsy of a right tonsil mass and excisional biopsy of a deep right jugular node and a nasopharyngeal biopsy performed.  At that time she had a large septal perforation and bilateral maxillary retention cyst but no maxillary sinus disease.  She has been on several rounds of antibiotics..  As well as saline irrigation with minimal benefit. She used to use cocaine regularly in the nose and developed a large septal perforation from this. She denies use of nasal drugs presently.  But she has had chronic nasal sinus issues. On review of her CT scan performed 1 month ago this showed chronic nasal sinus issues as well as some erosion of the inferior turbinates which was suggestive of possible chronic granulomatous disease.  Again noted is a large septal perforation. She is unable to breathe through her nose and has a lot of facial nasal pain and discomfort.  Past Medical History:  Diagnosis Date  . Asthma   . Bipolar disorder ([place]) 05/10/2016  . Seasonal allergies   . Seizure ([place])    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  Was on Phenobarbital at one time--perhaps moved her to depakote   Past Surgical History:  Procedure Laterality Date  . APPENDECTOMY    . OOPHORECTOMY     Not clear which side--per patient removed subsequent to TAH for ovarian cancer  . TOTAL ABDOMINAL HYSTERECTOMY     With unilateral oophorectomy   Social History   Socioeconomic History  . Marital status: Divorced    Spouse name: Not on file  . Number of children: 2  . Years of education: college  . Highest education level: Not on file  Occupational History  . Occupation: unemployed  Tobacco Use  . Smoking  status: Never Smoker  . Smokeless tobacco: Never Used  Substance and Sexual Activity  . Alcohol use: Yes    Alcohol/week: 1.0 standard drinks    Types: 1 Shots of liquor per week    Comment: occasional  . Drug use: No    Comment: History of cocaine abuse  . Sexual activity: Not Currently    Birth control/protection: None, Surgical  Other Topics Concern  . Not on file  Social History Narrative   Lives with current husband, Phylis Bougie.   He is employed   Scientist, physiological Strain:   . Difficulty of Paying Living Expenses:   Food Insecurity:   . Worried About Charity fundraiser in the Last Year:   . Arboriculturist in the Last Year:   Transportation Needs:   . Film/video editor (Medical):   Marland Kitchen Lack of Transportation (Non-Medical):   Physical Activity:   . Days of Exercise per Week:   . Minutes of Exercise per Session:   Stress:   . Feeling of Stress :   Social Connections:   . Frequency of Communication with Friends and Family:   . Frequency of Social Gatherings with Friends and Family:   . Attends Religious Services:   . Active Member of Clubs or Organizations:   . Attends Archivist Meetings:   Marland Kitchen Marital Status:  Family History  Problem Relation Age of Onset  . Hypertension Mother   . Thyroid disease Mother   . Diabetes Father   . Hypertension Father   . Heart disease Father   . Cancer Maternal Grandfather        colon  . Cancer Maternal Aunt        thyroid  . Cancer Maternal Uncle        thyroid  . Hypertension Paternal Uncle   . Asthma Brother    Allergies  Allergen Reactions  . Codeine Nausea And Vomiting  . Tagamet [Cimetidine] Itching and Rash   Prior to Admission medications   Medication Sig Start Date End Date Taking? Authorizing Provider  acetaminophen (TYLENOL) 500 MG tablet Take 1,000 mg by mouth every 6 (six) hours as needed for mild pain.   Yes [provider]  albuterol (PROVENTIL  HFA;VENTOLIN HFA) 108 (90 Base) MCG/ACT inhaler Inhale 1-2 puffs into the lungs every 6 (six) hours as needed for wheezing or shortness of breath. 01/31/18  Yes Wieters, Hallie C, PA-C  azithromycin (ZITHROMAX) 250 MG tablet Take 1 tablet (250 mg total) by mouth daily. Take first 2 tablets together, then 1 every day until finished. 10/07/19  Yes Pollina, Canary Brim, MD  fexofenadine-pseudoephedrine (ALLEGRA-D) 60-120 MG 12 hr tablet Take 1 tablet by mouth every 12 (twelve) hours. 10/07/19  Yes Pollina, Canary Brim, MD  ibuprofen (ADVIL,MOTRIN) 200 MG tablet Take 600 mg by mouth every 6 (six) hours as needed for moderate pain.   Yes [provider]  loratadine (CLARITIN) 10 MG tablet Take 10 mg by mouth daily.   Yes [provider]  Multiple Vitamin (MULTIVITAMIN WITH MINERALS) TABS tablet Take 1 tablet by mouth daily.   Yes [provider]  triamcinolone (NASACORT) 55 MCG/ACT AERO nasal inhaler Place 2 sprays into the nose daily. 10/07/19  Yes Pollina, Canary Brim, MD  cetirizine (ZYRTEC ALLERGY) 10 MG tablet Take 1 tablet (10 mg total) by mouth daily. Patient not taking: Reported on 10/06/2019 09/19/19 10/07/19  Darr, Veryl Speak, PA-C  fluticasone [facility]) 50 MCG/ACT nasal spray Place 1 spray into both nostrils daily. Patient not taking: Reported on 10/06/2019 09/19/19 10/07/19  Darr, Veryl Speak, PA-C     Positive ROS: Otherwise negative  All other systems have been reviewed and were otherwise negative with the exception of those mentioned in the HPI and as above.  Physical Exam: Constitutional: Alert, well-appearing, no acute distress Ears: External ears without lesions or tenderness. Ear canals are clear bilaterally with intact, clear TMs.  Nasal: External nose without lesions. Septum with a large subtotal perforation..  She has scabbing and crusting throughout her nose with no apparent normal mucosal on exam in the office today.  This is all very tender to exam.  Because of the  tenderness I cannot remove any of the crusting in the office today. Oral: Lips and gums without lesions. Tongue and palate mucosa without lesions. Posterior oropharynx clear. Neck: No palpable adenopathy or masses. Respiratory: Breathing comfortably.  Lungs clear to auscultation although she has history of asthma. Cardiac: Regular rate rhythm without murmur. Skin: No facial/neck lesions or rash noted.  Procedures  Assessment: Discussed with patient concerning extent of disease and chronic crusting within the nasal cavity.  She is unable to have an adequate biopsy performed in the office today.  Plan: Discussed extensively with her concerning surgical option to help debride and clean the nasal cavity and perform biopsy of nasal mucosa.  Can  open up the sinuses with limited procedure with FESS and biopsies. Following this she will have to be on antibiotics and frequent saline irrigations and possible steroids. We will plan on scheduling this at her convenience.  At time of surgery consider placement of Silastic stents on either side of the septum.   Narda Bonds, MD

## 2019-11-28 ENCOUNTER — Other Ambulatory Visit (INDEPENDENT_AMBULATORY_CARE_PROVIDER_SITE_OTHER): Payer: Self-pay

## 2019-11-28 ENCOUNTER — Encounter (INDEPENDENT_AMBULATORY_CARE_PROVIDER_SITE_OTHER): Payer: Self-pay

## 2019-11-28 NOTE — Progress Notes (Unsigned)
Pt called in stating she needed another antibiotic. As soon as she finishes antibotic, sinus pain and presure comes right back. Per Dr. Ezzard Standing, I called in to [facility] [address] for Ceftin 500mg  BID x 1week.

## 2019-11-29 ENCOUNTER — Encounter (INDEPENDENT_AMBULATORY_CARE_PROVIDER_SITE_OTHER): Payer: Self-pay

## 2019-11-29 NOTE — Progress Notes (Unsigned)
With trying to obtain a P/A for pt to have surgery, her insurance will not cover until after being active for 6 months. This will put surgery after 04/10/2020.

## 2019-12-10 ENCOUNTER — Encounter (INDEPENDENT_AMBULATORY_CARE_PROVIDER_SITE_OTHER): Payer: Self-pay

## 2019-12-10 NOTE — Progress Notes (Unsigned)
Pt called in wanting antibotic for sinus infection. Per DR. Newman I called into [facility] [address]- Ceftin 500 mg BID x 1 week w 1 refill and Steropred 10 mg dose pak.

## 2020-01-16 ENCOUNTER — Other Ambulatory Visit (INDEPENDENT_AMBULATORY_CARE_PROVIDER_SITE_OTHER): Payer: Self-pay

## 2020-01-16 MED ORDER — CEFTIN 250 MG/5ML PO SUSR: 250.0000 mg | Freq: Two times a day (BID) | ORAL | 1 refills | Status: AC

## 2020-07-02 ENCOUNTER — Telehealth (INDEPENDENT_AMBULATORY_CARE_PROVIDER_SITE_OTHER): Payer: Self-pay

## 2020-08-03 ENCOUNTER — Ambulatory Visit (HOSPITAL_COMMUNITY): Admit: 2020-08-03 | Discharge status: Against Medical Advice | Payer: PRIVATE HEALTH INSURANCE

## 2020-08-04 ENCOUNTER — Other Ambulatory Visit: Payer: Self-pay

## 2020-08-04 ENCOUNTER — Encounter (HOSPITAL_COMMUNITY): Payer: Self-pay | Admitting: Emergency Medicine

## 2020-08-04 ENCOUNTER — Ambulatory Visit (HOSPITAL_COMMUNITY)
Admission: EM | Admit: 2020-08-04 | Discharge: 2020-08-04 | Disposition: A | Discharge status: Home / Self Care | Payer: Self-pay | Attending: Urgent Care | Admitting: Urgent Care

## 2020-08-04 DIAGNOSIS — H02845 Edema of left lower eyelid: Secondary | ICD-10-CM

## 2020-08-04 DIAGNOSIS — H02844 Edema of left upper eyelid: Secondary | ICD-10-CM

## 2020-08-04 DIAGNOSIS — L03213 Periorbital cellulitis: Secondary | ICD-10-CM

## 2020-08-04 MED ORDER — CEFDINIR 300 MG PO CAPS: 300.0000 mg | ORAL_CAPSULE | Freq: Two times a day (BID) | ORAL | 0 refills | Status: DC

## 2020-08-04 MED ORDER — NAPROXEN 500 MG PO TABS: 500.0000 mg | ORAL_TABLET | Freq: Two times a day (BID) | ORAL | 0 refills | Status: DC

## 2020-08-04 MED ORDER — SULFAMETHOXAZOLE-TRIMETHOPRIM 800-160 MG PO TABS: 1.0000 | ORAL_TABLET | Freq: Two times a day (BID) | ORAL | 0 refills | Status: DC

## 2020-08-04 MED ORDER — HYDROXYZINE HCL 25 MG PO TABS: 12.5000 mg | ORAL_TABLET | Freq: Three times a day (TID) | ORAL | 0 refills | Status: DC | PRN

## 2020-08-04 NOTE — ED Provider Notes (Signed)
[facility]   MRN: [id number] DOB: 02/02/1956  Subjective:   Mary Mccormick is a 65 y.o. female presenting for 3-day history of acute onset and worsening left thigh swelling of the upper and lower eyelids, severe tenderness.  Patient states that she has been trying to renovate a house for sale.  Cannot recall any particular inciting events.  Has noticed a rash over her chest that is very itchy.  States that she has very sensitive skin.  Has been using pain supplies, cleaning supplies for the home.  Denies photophobia, eye redness, eye drainage.  Denies vision change.  She does have a history of severe sinus infection and underwent multiple antibiotics last year.  She states that the symptoms she has now are different.  No current facility-administered medications for this encounter.  Current Outpatient Medications:  .  acetaminophen (TYLENOL) 500 MG tablet, Take 1,000 mg by mouth every 6 (six) hours as needed for mild pain., Disp: , Rfl:  .  albuterol (PROVENTIL HFA;VENTOLIN HFA) 108 (90 Base) MCG/ACT inhaler, Inhale 1-2 puffs into the lungs every 6 (six) hours as needed for wheezing or shortness of breath., Disp: 1 Inhaler, Rfl: 0 .  azithromycin (ZITHROMAX) 250 MG tablet, Take 1 tablet (250 mg total) by mouth daily. Take first 2 tablets together, then 1 every day until finished., Disp: 6 tablet, Rfl: 0 .  fexofenadine-pseudoephedrine (ALLEGRA-D) 60-120 MG 12 hr tablet, Take 1 tablet by mouth every 12 (twelve) hours., Disp: 20 tablet, Rfl: 0 .  ibuprofen (ADVIL,MOTRIN) 200 MG tablet, Take 600 mg by mouth every 6 (six) hours as needed for moderate pain., Disp: , Rfl:  .  loratadine (CLARITIN) 10 MG tablet, Take 10 mg by mouth daily., Disp: , Rfl:  .  Multiple Vitamin (MULTIVITAMIN WITH MINERALS) TABS tablet, Take 1 tablet by mouth daily., Disp: , Rfl:  .  triamcinolone (NASACORT) 55 MCG/ACT AERO nasal inhaler, Place 2 sprays into the nose daily., Disp: 1 Inhaler, Rfl: 12    Allergies  Allergen Reactions  . Codeine Nausea And Vomiting  . Tagamet [Cimetidine] Itching and Rash    Past Medical History:  Diagnosis Date  . Asthma   . Bipolar disorder (HCC) 05/10/2016  . Seasonal allergies   . 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  Was on Phenobarbital at one time--perhaps moved her to depakote     Past Surgical History:  Procedure Laterality Date  . APPENDECTOMY    . OOPHORECTOMY     Not clear which side--per patient removed subsequent to TAH for ovarian cancer  . TOTAL ABDOMINAL HYSTERECTOMY     With unilateral oophorectomy    Family History  Problem Relation Age of Onset  . Hypertension Mother   . Thyroid disease Mother   . Diabetes Father   . Hypertension Father   . Heart disease Father   . Cancer Maternal Grandfather        colon  . Cancer Maternal Aunt        thyroid  . Cancer Maternal Uncle        thyroid  . Hypertension Paternal Uncle   . Asthma Brother     Social History   Tobacco Use  . Smoking status: Never Smoker  . Smokeless tobacco: Never Used  Substance Use Topics  . Alcohol use: Yes    Alcohol/week: 1.0 standard drink    Types: 1 Shots of liquor per week    Comment: occasional  . Drug  use: No    Comment: History of cocaine abuse    ROS   Objective:   Vitals: BP (!) 163/88 (BP Location: Right Arm)   Pulse 64   Temp 97.7 F (36.5 C) (Oral)   Resp 16   SpO2 98%   Physical Exam Constitutional:      General: She is not in acute distress.    Appearance: Normal appearance. She is well-developed. She is not ill-appearing, toxic-appearing or diaphoretic.  HENT:     Head: Normocephalic and atraumatic.     Nose: Nose normal.     Mouth/Throat:     Mouth: Mucous membranes are moist.     Pharynx: Oropharynx is clear.  Eyes:     General: No scleral icterus.       Right eye: No foreign body, discharge or hordeolum.        Left eye: No foreign body, discharge or hordeolum.     Extraocular Movements:  Extraocular movements intact.     Conjunctiva/sclera: Conjunctivae normal.     Right eye: Right conjunctiva is not injected. No chemosis, exudate or hemorrhage.    Left eye: Left conjunctiva is not injected. No chemosis, exudate or hemorrhage.    Pupils: Pupils are equal, round, and reactive to light.   Cardiovascular:     Rate and Rhythm: Normal rate.  Pulmonary:     Effort: Pulmonary effort is normal.  Skin:    General: Skin is warm and dry.  Neurological:     General: No focal deficit present.     Mental Status: She is alert and oriented to person, place, and time.  Psychiatric:        Mood and Affect: Mood normal.        Behavior: Behavior normal.        Thought Content: Thought content normal.        Judgment: Judgment normal.     Assessment and Plan :   PDMP not reviewed this encounter.  1. Preseptal cellulitis of left eye   2. Swelling of left upper eyelid   3. Swelling of left lower eyelid     Recommended combination Bactrim, cefdinir per up-to-date for management of preseptal cellulitis.  Use naproxen for pain control.  Strict ER precautions. Counseled patient on potential for adverse effects with medications prescribed/recommended today, ER and return-to-clinic precautions discussed, patient verbalized understanding.    Wallis Bamberg, PA-C 08/04/20 1228

## 2020-08-04 NOTE — ED Triage Notes (Addendum)
Pt presents with left eye swelling xs 3 days. States has been working in house to renovate, unsure if something bit her or got into eye. States has tried ibuprofen and ice and heat with minimal relief.

## 2020-08-06 ENCOUNTER — Emergency Department (HOSPITAL_COMMUNITY)
Admission: EM | Admit: 2020-08-06 | Discharge: 2020-08-07 | Disposition: A | Discharge status: Home / Self Care | Payer: Self-pay | Attending: Emergency Medicine | Admitting: Emergency Medicine

## 2020-08-06 ENCOUNTER — Other Ambulatory Visit: Payer: Self-pay

## 2020-08-06 ENCOUNTER — Encounter (HOSPITAL_COMMUNITY): Payer: Self-pay | Admitting: Emergency Medicine

## 2020-08-06 ENCOUNTER — Ambulatory Visit (HOSPITAL_COMMUNITY): Admission: EM | Admit: 2020-08-06 | Discharge: 2020-08-06 | Payer: Self-pay

## 2020-08-06 ENCOUNTER — Encounter (HOSPITAL_COMMUNITY): Payer: Self-pay

## 2020-08-06 DIAGNOSIS — L723 Sebaceous cyst: Secondary | ICD-10-CM | POA: Insufficient documentation

## 2020-08-06 DIAGNOSIS — H05012 Cellulitis of left orbit: Secondary | ICD-10-CM

## 2020-08-06 DIAGNOSIS — L089 Local infection of the skin and subcutaneous tissue, unspecified: Secondary | ICD-10-CM | POA: Insufficient documentation

## 2020-08-06 DIAGNOSIS — Z20822 Contact with and (suspected) exposure to covid-19: Secondary | ICD-10-CM | POA: Insufficient documentation

## 2020-08-06 DIAGNOSIS — J45909 Unspecified asthma, uncomplicated: Secondary | ICD-10-CM | POA: Insufficient documentation

## 2020-08-06 LAB — COMPREHENSIVE METABOLIC PANEL
ALT: 20 U/L (ref 0–44)
AST: 21 U/L (ref 15–41)
Albumin: 3.7 g/dL (ref 3.5–5.0)
Alkaline Phosphatase: 72 U/L (ref 38–126)
Anion gap: 10 (ref 5–15)
BUN: 16 mg/dL (ref 8–23)
CO2: 25 mmol/L (ref 22–32)
Calcium: 9.2 mg/dL (ref 8.9–10.3)
Chloride: 101 mmol/L (ref 98–111)
Creatinine, Ser: 1.05 mg/dL — ABNORMAL HIGH (ref 0.44–1.00)
GFR, Estimated: 60 mL/min — ABNORMAL LOW (ref >60)
Glucose, Bld: 103 mg/dL — ABNORMAL HIGH (ref 70–99)
Potassium: 4.2 mmol/L (ref 3.5–5.1)
Sodium: 136 mmol/L (ref 135–145)
Total Bilirubin: 0.4 mg/dL (ref 0.3–1.2)
Total Protein: 6.6 g/dL (ref 6.5–8.1)

## 2020-08-06 LAB — CBC WITH DIFFERENTIAL/PLATELET
Abs Immature Granulocytes: 0.04 10*3/uL (ref 0.00–0.07)
Basophils Absolute: 0.1 10*3/uL (ref 0.0–0.1)
Basophils Relative: 1 %
Eosinophils Absolute: 1.6 10*3/uL — ABNORMAL HIGH (ref 0.0–0.5)
Eosinophils Relative: 14 %
HCT: 37 % (ref 36.0–46.0)
Hemoglobin: 12.1 g/dL (ref 12.0–15.0)
Immature Granulocytes: 0 %
Lymphocytes Relative: 18 %
Lymphs Abs: 2.1 10*3/uL (ref 0.7–4.0)
MCH: 28.7 pg (ref 26.0–34.0)
MCHC: 32.7 g/dL (ref 30.0–36.0)
MCV: 87.9 fL (ref 80.0–100.0)
Monocytes Absolute: 0.9 10*3/uL (ref 0.1–1.0)
Monocytes Relative: 8 %
Neutro Abs: 7 10*3/uL (ref 1.7–7.7)
Neutrophils Relative %: 59 %
Platelets: 541 10*3/uL — ABNORMAL HIGH (ref 150–400)
RBC: 4.21 MIL/uL (ref 3.87–5.11)
RDW: 13.2 % (ref 11.5–15.5)
WBC: 11.7 10*3/uL — ABNORMAL HIGH (ref 4.0–10.5)
nRBC: 0 % (ref 0.0–0.2)

## 2020-08-06 NOTE — ED Triage Notes (Signed)
Pt arrives POV for eval of L eye cellulitis onset Tuesday. Pt reports that she was seen at [facility] on Tuesday, started on PO abx at that time, now here for apparent periorbital cellulitis. Endorses blurry vision.

## 2020-08-06 NOTE — ED Triage Notes (Signed)
Pt here for left eye swelling and blurred vision ... seen here on 08/04/20 for similar sx   Was given antibiotics and she's taken them as prescribed  Reports she was told to come here if it's getting worse.   A&O x4... NAD.Marland Kitchen. ambulatory

## 2020-08-06 NOTE — ED Provider Notes (Signed)
[facility]    CSN: [id number] Arrival date & time: 08/06/20  1731      History   Chief Complaint Chief Complaint  Patient presents with  . Eye Pain    HPI Mary Mccormick is a 65 y.o. female presenting for eye pain and worsening of infection. She was seen here 2 days ago for same complaint and was diagnosed with preseptal cellulitis, prescribed bactrim and cefdinir. History of asthma, bipolar, seasonal allergies, seizure 15 years ago. Presenting for left eye swelling and blurred vision, getting worse. Endorses fevers up to 100 over the last 2 days. States she has been taking the antibiotics as directed. Wears glasses, not contacts.   HPI  Past Medical History:  Diagnosis Date  . Asthma   . Bipolar disorder (HCC) 05/10/2016  . Seasonal allergies   . 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  Was on Phenobarbital at one time--perhaps moved her to depakote    Patient Active Problem List   Diagnosis Date Noted  . Sinusitis 05/10/2016  . History of cocaine abuse (HCC) 05/10/2016  . Bipolar disorder (HCC) 05/10/2016  . Asthma   . Seasonal allergies   . Seizure [facility])     Past Surgical History:  Procedure Laterality Date  . APPENDECTOMY    . OOPHORECTOMY     Not clear which side--per patient removed subsequent to TAH for ovarian cancer  . TOTAL ABDOMINAL HYSTERECTOMY     With unilateral oophorectomy    OB History    Gravida  3   Para  2   Term  2   Preterm      AB  1   Living  2     SAB      IAB      Ectopic      Multiple      Live Births               Home Medications    Prior to Admission medications   Medication Sig Start Date End Date Taking? Authorizing Provider  acetaminophen (TYLENOL) 500 MG tablet Take 1,000 mg by mouth every 6 (six) hours as needed for mild pain.    [provider]  albuterol (PROVENTIL HFA;VENTOLIN HFA) 108 (90 Base) MCG/ACT inhaler Inhale 1-2 puffs into the lungs every 6 (six) hours as needed  for wheezing or shortness of breath. 01/31/18   Wieters, Hallie C, PA-C  clindamycin (CLEOCIN) 150 MG capsule Take 2 capsules (300 mg total) by mouth 3 (three) times daily. May dispense as 150mg  capsules 08/07/20   Harris, Abigail, PA-C  fexofenadine-pseudoephedrine (ALLEGRA-D) 60-120 MG 12 hr tablet Take 1 tablet by mouth every 12 (twelve) hours. Patient taking differently: Take 1 tablet by mouth as needed (allergies). 10/07/19   12/07/19, MD  hydrOXYzine (ATARAX/VISTARIL) 25 MG tablet Take 0.5-1 tablets (12.5-25 mg total) by mouth every 8 (eight) hours as needed for itching. 08/04/20   10/02/20, PA-C  ibuprofen (ADVIL,MOTRIN) 200 MG tablet Take 600 mg by mouth every 6 (six) hours as needed for moderate pain.    [provider]  loratadine (CLARITIN) 10 MG tablet Take 10 mg by mouth daily.    [provider]  Multiple Vitamin (MULTIVITAMIN WITH MINERALS) TABS tablet Take 1 tablet by mouth daily.    [provider]  naproxen (NAPROSYN) 500 MG tablet Take 1 tablet (500 mg total) by mouth 2 (two) times daily with a meal. 08/04/20   10/02/20,  Freida Busman, PA-C  triamcinolone (NASACORT) 55 MCG/ACT AERO nasal inhaler Place 2 sprays into the nose daily. 10/07/19   Orpah Greek, MD  cetirizine (ZYRTEC ALLERGY) 10 MG tablet Take 1 tablet (10 mg total) by mouth daily. Patient not taking: Reported on 10/06/2019 09/19/19 10/07/19  Darr, Edison Nasuti, PA-C  fluticasone [facility]) 50 MCG/ACT nasal spray Place 1 spray into both nostrils daily. Patient not taking: Reported on 10/06/2019 09/19/19 10/07/19  Darr, Edison Nasuti, PA-C    Family History Family History  Problem Relation Age of Onset  . Hypertension Mother   . Thyroid disease Mother   . Diabetes Father   . Hypertension Father   . Heart disease Father   . Cancer Maternal Grandfather        colon  . Cancer Maternal Aunt        thyroid  . Cancer Maternal Uncle        thyroid  . Hypertension Paternal Uncle   . Asthma Brother     Social  History Social History   Tobacco Use  . Smoking status: Never Smoker  . Smokeless tobacco: Never Used  Substance Use Topics  . Alcohol use: Yes    Alcohol/week: 1.0 standard drink    Types: 1 Shots of liquor per week    Comment: occasional  . Drug use: No    Comment: History of cocaine abuse     Allergies   Codeine and Tagamet [cimetidine]   Review of Systems Review of Systems  Eyes: Positive for pain and visual disturbance.  All other systems reviewed and are negative.    Physical Exam Triage Vital Signs ED Triage Vitals  Enc Vitals Group     BP 08/06/20 1855 136/83     Pulse Rate 08/06/20 1855 87     Resp 08/06/20 1855 20     Temp 08/06/20 1855 98 F (36.7 C)     Temp Source 08/06/20 1855 Oral     SpO2 08/06/20 1855 99 %     Weight --      Height --      Head Circumference --      Peak Flow --      Pain Score 08/06/20 1857 10     Pain Loc --      Pain Edu? --      Excl. in [place]? --    No data found.  Updated Vital Signs BP 136/83 (BP Location: Left Arm)   Pulse 87   Temp 98 F (36.7 C) (Oral)   Resp 20   SpO2 99%   Visual Acuity Right Eye Distance: 20/30 Left Eye Distance: 20/70 Bilateral Distance: 20/50  Right Eye Near:   Left Eye Near:    Bilateral Near:     Physical Exam Vitals reviewed.  Constitutional:      Appearance: Normal appearance.  Eyes:     Comments: Significant swelling and erythema of eyebrow and eyelid, with tenderness and fluctuance. EOMI and PERRLA, but significant pain with EOMI.   Cardiovascular:     Rate and Rhythm: Normal rate and regular rhythm.     Heart sounds: Normal heart sounds.  Pulmonary:     Effort: Pulmonary effort is normal.     Breath sounds: Normal breath sounds.  Neurological:     General: No focal deficit present.     Mental Status: She is alert and oriented to person, place, and time.  Psychiatric:        Mood and Affect: Mood normal.  Behavior: Behavior normal.        Thought Content:  Thought content normal.        Judgment: Judgment normal.      UC Treatments / Results  Labs (all labs ordered are listed, but only abnormal results are displayed) Labs Reviewed - No data to display  EKG   Radiology No results found.  Procedures Procedures (including critical care time)  Medications Ordered in UC Medications - No data to display  Initial Impression / Assessment and Plan / UC Course  I have reviewed the triage vital signs and the nursing notes.  Pertinent labs & imaging results that were available during my care of the patient were reviewed by me and considered in my medical decision making (see chart for details).    Vision today:  Right Eye Distance: 20/30 Left Eye Distance: 20/70 Bilateral Distance: 20/50 She wears glasses but not contacts.  This pt was seen for preseptal cellulitis at this urgent care 2 days ago (08/04/2020), and started on Bactrim and cefdinir. Today presenting with worsening of symptoms despite treatment. She meets criteria for orbital cellulitis. Concern for sepsis given pt's subjective chills and fever of 100 at home. At this visit, she is afebrile nontachycardic nontachypneic, oxygenating well on room air, safe for transport in vehicle driven by friend. She will head straight to [facility] ED. She understands to call 911 if any chest pain, shortness of breath, syncope, etc on the way to ED.  Final Clinical Impressions(s) / UC Diagnoses   Final diagnoses:  Cellulitis of left orbital region     Discharge Instructions     Head straight to [facility] ED.    ED Prescriptions    None     PDMP not reviewed this encounter.   Rhys Martini, PA-C 08/10/20 [phone]

## 2020-08-06 NOTE — Discharge Instructions (Addendum)
Head straight to [place] ED 

## 2020-08-06 NOTE — ED Notes (Signed)
Patient is being discharged from the Urgent Care and sent to the Emergency Department via personal vehicle with family . Per provider Ignacia Bayley, patient is in need of higher level of care due to severity of cellulitis of eye. Patient is aware and verbalizes understanding of plan of care.   Vitals:   08/06/20 1855  BP: 136/83  Pulse: 87  Resp: 20  Temp: 98 F (36.7 C)  SpO2: 99%

## 2020-08-07 LAB — POC SARS CORONAVIRUS 2 AG -  ED: SARS Coronavirus 2 Ag: NEGATIVE

## 2020-08-07 MED ORDER — CLINDAMYCIN HCL 150 MG PO CAPS: 300.0000 mg | ORAL_CAPSULE | Freq: Three times a day (TID) | ORAL | 0 refills | Status: DC

## 2020-08-07 MED ORDER — LIDOCAINE-EPINEPHRINE 1 %-1:100000 IJ SOLN
20.0000 mL | Freq: Once | INTRAMUSCULAR | Status: DC
  Filled 2020-08-07: qty 1

## 2020-08-07 NOTE — ED Provider Notes (Signed)
[facility] EMERGENCY DEPARTMENT Provider Note   CSN: [id number] Arrival date & time: 08/06/20  2012     History Chief Complaint  Patient presents with  . Cellulitis    Mary Mccormick is a 65 y.o. female who was sent from the [facility] for evaluation of Left eye infection. The patient was seen on January 4 at the [facility] health urgent care.  She was placed on Bactrim and cefdinir.  She returned last night with complaint of worsening pain, chills, fever over 100 and blurry vision. She was sent to the ED for further evaluation.  HPI     Past Medical History:  Diagnosis Date  . Asthma   . Bipolar disorder (HCC) 05/10/2016  . Seasonal allergies   . 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  Was on Phenobarbital at one time--perhaps moved her to depakote    Patient Active Problem List   Diagnosis Date Noted  . Sinusitis 05/10/2016  . History of cocaine abuse (HCC) 05/10/2016  . Bipolar disorder (HCC) 05/10/2016  . Asthma   . Seasonal allergies   . Seizure [facility])     Past Surgical History:  Procedure Laterality Date  . APPENDECTOMY    . OOPHORECTOMY     Not clear which side--per patient removed subsequent to TAH for ovarian cancer  . TOTAL ABDOMINAL HYSTERECTOMY     With unilateral oophorectomy     OB History    Gravida  3   Para  2   Term  2   Preterm      AB  1   Living  2     SAB      IAB      Ectopic      Multiple      Live Births              Family History  Problem Relation Age of Onset  . Hypertension Mother   . Thyroid disease Mother   . Diabetes Father   . Hypertension Father   . Heart disease Father   . Cancer Maternal Grandfather        colon  . Cancer Maternal Aunt        thyroid  . Cancer Maternal Uncle        thyroid  . Hypertension Paternal Uncle   . Asthma Brother     Social History   Tobacco Use  . Smoking status: Never Smoker  . Smokeless tobacco: Never Used  Substance Use Topics  . Alcohol use:  Yes    Alcohol/week: 1.0 standard drink    Types: 1 Shots of liquor per week    Comment: occasional  . Drug use: No    Comment: History of cocaine abuse    Home Medications Prior to Admission medications   Medication Sig Start Date End Date Taking? Authorizing Provider  acetaminophen (TYLENOL) 500 MG tablet Take 1,000 mg by mouth every 6 (six) hours as needed for mild pain.    [provider]  albuterol (PROVENTIL HFA;VENTOLIN HFA) 108 (90 Base) MCG/ACT inhaler Inhale 1-2 puffs into the lungs every 6 (six) hours as needed for wheezing or shortness of breath. 01/31/18   Wieters, Hallie C, PA-C  cefdinir (OMNICEF) 300 MG capsule Take 1 capsule (300 mg total) by mouth 2 (two) times daily. 08/04/20   Wallis Bamberg, PA-C  fexofenadine-pseudoephedrine (ALLEGRA-D) 60-120 MG 12 hr tablet Take 1 tablet by mouth every 12 (twelve) hours. 10/07/19  Gilda Crease, MD  hydrOXYzine (ATARAX/VISTARIL) 25 MG tablet Take 0.5-1 tablets (12.5-25 mg total) by mouth every 8 (eight) hours as needed for itching. 08/04/20   Wallis Bamberg, PA-C  ibuprofen (ADVIL,MOTRIN) 200 MG tablet Take 600 mg by mouth every 6 (six) hours as needed for moderate pain.    [provider]  loratadine (CLARITIN) 10 MG tablet Take 10 mg by mouth daily.    [provider]  Multiple Vitamin (MULTIVITAMIN WITH MINERALS) TABS tablet Take 1 tablet by mouth daily.    [provider]  naproxen (NAPROSYN) 500 MG tablet Take 1 tablet (500 mg total) by mouth 2 (two) times daily with a meal. 08/04/20   Wallis Bamberg, PA-C  sulfamethoxazole-trimethoprim (BACTRIM DS) 800-160 MG tablet Take 1 tablet by mouth 2 (two) times daily. 08/04/20   Wallis Bamberg, PA-C  triamcinolone (NASACORT) 55 MCG/ACT AERO nasal inhaler Place 2 sprays into the nose daily. 10/07/19   Gilda Crease, MD  cetirizine (ZYRTEC ALLERGY) 10 MG tablet Take 1 tablet (10 mg total) by mouth daily. Patient not taking: Reported on 10/06/2019 09/19/19 10/07/19   Darr, Gerilyn Pilgrim, PA-C  fluticasone [facility]) 50 MCG/ACT nasal spray Place 1 spray into both nostrils daily. Patient not taking: Reported on 10/06/2019 09/19/19 10/07/19  Darr, Gerilyn Pilgrim, PA-C    Allergies    Codeine and Tagamet [cimetidine]  Review of Systems   Review of Systems  Constitutional: Positive for chills and fever.  HENT: Positive for facial swelling (Left eyebrow).   Eyes: Negative for photophobia, pain, discharge, redness and itching. Visual disturbance: blurriness.  Respiratory: Negative.   Cardiovascular: Negative.   Gastrointestinal: Negative.   Endocrine: Negative.   Genitourinary: Negative.   Musculoskeletal: Negative.   Neurological: Negative.     Physical Exam Updated Vital Signs BP 135/63   Pulse 73   Temp 97.7 F (36.5 C)   Resp 18   Ht 5' (1.524 m)   Wt 59 kg   SpO2 99%   BMI 25.39 kg/m   Physical Exam Vitals and nursing note reviewed.  Constitutional:      General: She is not in acute distress.    Appearance: She is well-developed and well-nourished. She is not diaphoretic.  HENT:     Head: Normocephalic and atraumatic.  Eyes:     General: Lids are normal. Vision grossly intact. Gaze aligned appropriately. No scleral icterus.    Extraocular Movements: Extraocular movements intact.     Conjunctiva/sclera: Conjunctivae normal.     Comments: Left eyebrow with large area of swelling, erythema. There is a central purulent pustule. It is exquisitely ttp. The eyebrow hangs down due to weight to the mid visual field. No reported blurry vision when manually elevated. No Surrounding periorbital erythema, No proptosis. No pain with eye movement.  Cardiovascular:     Rate and Rhythm: Normal rate and regular rhythm.     Heart sounds: Normal heart sounds. No murmur heard. No friction rub. No gallop.   Pulmonary:     Effort: Pulmonary effort is normal. No respiratory distress.     Breath sounds: Normal breath sounds.  Abdominal:     General: Bowel sounds are  normal. There is no distension.     Palpations: Abdomen is soft. There is no mass.     Tenderness: There is no abdominal tenderness. There is no guarding.  Musculoskeletal:     Cervical back: Normal range of motion.  Skin:    General: Skin is warm and dry.  Neurological:  Mental Status: She is alert and oriented to person, place, and time.  Psychiatric:        Behavior: Behavior normal.     ED Results / Procedures / Treatments   Labs (all labs ordered are listed, but only abnormal results are displayed) Labs Reviewed  CBC WITH DIFFERENTIAL/PLATELET - Abnormal; Notable for the following components:      Result Value   WBC 11.7 (*)    Platelets 541 (*)    Eosinophils Absolute 1.6 (*)    All other components within normal limits  COMPREHENSIVE METABOLIC PANEL - Abnormal; Notable for the following components:   Glucose, Bld 103 (*)    Creatinine, Ser 1.05 (*)    GFR, Estimated 60 (*)    All other components within normal limits    EKG None  Radiology No results found.  Procedures .Marland KitchenIncision and Drainage  Date/Time: 08/07/2020 1:29 PM Performed by: Arthor Captain, PA-C Authorized by: Arthor Captain, PA-C   Consent:    Consent obtained:  Verbal   Consent given by:  Patient   Risks, benefits, and alternatives were discussed: yes     Risks discussed:  Bleeding, incomplete drainage and pain   Alternatives discussed:  No treatment Universal protocol:    Patient identity confirmed:  Verbally with patient Location:    Type:  Abscess   Size:  3 cm   Location:  Head   Head/neck location: L eyebrow. Pre-procedure details:    Skin preparation:  Povidone-iodine Procedure type:    Complexity:  Complex Procedure details:    Needle aspiration: yes     Needle size:  18 G   Incision types:  Stab incision   Incision depth:  Dermal   Wound management:  Probed and deloculated and irrigated with saline   Drainage:  Purulent (sebaceous)   Drainage amount:  Copious   Wound  treatment:  Wound left open Post-procedure details:    Procedure completion:  Tolerated   (including critical care time)  Medications Ordered in ED Medications - No data to display  ED Course  I have reviewed the triage vital signs and the nursing notes.  Pertinent labs & imaging results that were available during my care of the patient were reviewed by me and considered in my medical decision making (see chart for details).    MDM Rules/Calculators/A&P                           65 year old female here with complaint of left eyebrow infection.  She was sent from urgent care to rule out orbital cellulitis.  Clinically the patient does not appear to have that.  She has a palpable area of fluctuance and induration concerning for abscess.  I was able to drain a large area of purulence.  There was a loculated pocket chest superior to this region which appeared to be an sebaceous cyst.  This likely represented infectious from inflamed sebaceous cyst.  I was able to clear all of the purulent and sebaceous material.  I flushed the area thoroughly.  There is a 5 mm opening for continued drainage.  I placed a bandage.  We will switch to clindamycin.  Patient appears otherwise appropriate for discharge at this time with close outpatient follow-up. Final Clinical Impression(s) / ED Diagnoses Final diagnoses:  None    Rx / DC Orders ED Discharge Orders    None       Arthor Captain, PA-C 08/07/20 1333  Varney Biles, MD 08/07/20 1446

## 2020-08-07 NOTE — Discharge Instructions (Addendum)
Please stop taking your cefdinir and Bactrim.  You may begin taking the medication as prescribed.  Use Motrin and Tylenol for pain.  Apply ice to the affected area to reduce swelling.  It appears you have seen Dr. Ezzard Standing in the past.  He may follow-up with him as an ear nose and throat doctor or you may follow-up with Dr. Wynelle Link who is listed in your discharge paperwork. If you begin having severe eye pain especially behind your eye, bulging of your eye or changes in vision needs to present to the emergency department immediately.

## 2020-09-28 ENCOUNTER — Encounter (HOSPITAL_COMMUNITY): Payer: Self-pay | Admitting: Emergency Medicine

## 2020-09-28 ENCOUNTER — Other Ambulatory Visit: Payer: Self-pay

## 2020-09-28 ENCOUNTER — Ambulatory Visit (HOSPITAL_COMMUNITY)
Admission: EM | Admit: 2020-09-28 | Discharge: 2020-09-28 | Disposition: A | Discharge status: Home / Self Care | Payer: Self-pay | Attending: Urgent Care | Admitting: Urgent Care

## 2020-09-28 DIAGNOSIS — J029 Acute pharyngitis, unspecified: Secondary | ICD-10-CM | POA: Insufficient documentation

## 2020-09-28 DIAGNOSIS — R490 Dysphonia: Secondary | ICD-10-CM | POA: Insufficient documentation

## 2020-09-28 DIAGNOSIS — J4530 Mild persistent asthma, uncomplicated: Secondary | ICD-10-CM | POA: Insufficient documentation

## 2020-09-28 DIAGNOSIS — B349 Viral infection, unspecified: Secondary | ICD-10-CM | POA: Insufficient documentation

## 2020-09-28 DIAGNOSIS — J3089 Other allergic rhinitis: Secondary | ICD-10-CM | POA: Insufficient documentation

## 2020-09-28 DIAGNOSIS — R0981 Nasal congestion: Secondary | ICD-10-CM | POA: Insufficient documentation

## 2020-09-28 LAB — POCT RAPID STREP A, ED / UC: Streptococcus, Group A Screen (Direct): NEGATIVE

## 2020-09-28 MED ORDER — PREDNISONE 50 MG PO TABS: 50.0000 mg | ORAL_TABLET | Freq: Every day | ORAL | 0 refills | Status: DC

## 2020-09-28 MED ORDER — PROMETHAZINE-DM 6.25-15 MG/5ML PO SYRP: 5.0000 mL | ORAL_SOLUTION | Freq: Every evening | ORAL | 0 refills | Status: DC | PRN

## 2020-09-28 NOTE — Discharge Instructions (Signed)
We will manage this as a viral syndrome likely worsened by your bad allergies and asthma. For sore throat or cough try using a honey-based tea. Use 3 teaspoons of honey with juice squeezed from half lemon. Place shaved pieces of ginger into 1/2-1 cup of water and warm over stove top. Then mix the ingredients and repeat every 4 hours as needed. Please take Tylenol 500mg -650mg  once every 6 hours for fevers, aches and pains. Hydrate very well with at least 2 liters (64 ounces) of water. Eat light meals such as soups (chicken and noodles, chicken wild rice, vegetable).  Do not eat any foods that you are allergic to.  Start an antihistamine like Zyrtec, Allegra or Claritin for postnasal drainage, sinus congestion.

## 2020-09-28 NOTE — ED Provider Notes (Signed)
[name]   MRN: [id number] DOB: Oct 10, 1955  Subjective:   Mary Mccormick is a 65 y.o. female presenting for 3-day history of cute onset sore throat, hoarseness of her voice, painful swallowing, sinus congestion.  Patient has a history of allergic rhinitis, asthma.  Denies any cough, chest pain, shortness of breath.  Patient does not want to be tested for COVID-19.  No current facility-administered medications for this encounter.  Current Outpatient Medications:  .  acetaminophen (TYLENOL) 500 MG tablet, Take 1,000 mg by mouth every 6 (six) hours as needed for mild pain., Disp: , Rfl:  .  albuterol (PROVENTIL HFA;VENTOLIN HFA) 108 (90 Base) MCG/ACT inhaler, Inhale 1-2 puffs into the lungs every 6 (six) hours as needed for wheezing or shortness of breath., Disp: 1 Inhaler, Rfl: 0 .  fexofenadine-pseudoephedrine (ALLEGRA-D) 60-120 MG 12 hr tablet, Take 1 tablet by mouth every 12 (twelve) hours. (Patient taking differently: Take 1 tablet by mouth as needed (allergies).), Disp: 20 tablet, Rfl: 0 .  hydrOXYzine (ATARAX/VISTARIL) 25 MG tablet, Take 0.5-1 tablets (12.5-25 mg total) by mouth every 8 (eight) hours as needed for itching., Disp: 30 tablet, Rfl: 0 .  ibuprofen (ADVIL,MOTRIN) 200 MG tablet, Take 600 mg by mouth every 6 (six) hours as needed for moderate pain., Disp: , Rfl:  .  loratadine (CLARITIN) 10 MG tablet, Take 10 mg by mouth daily., Disp: , Rfl:  .  Multiple Vitamin (MULTIVITAMIN WITH MINERALS) TABS tablet, Take 1 tablet by mouth daily., Disp: , Rfl:  .  naproxen (NAPROSYN) 500 MG tablet, Take 1 tablet (500 mg total) by mouth 2 (two) times daily with a meal., Disp: 30 tablet, Rfl: 0 .  triamcinolone (NASACORT) 55 MCG/ACT AERO nasal inhaler, Place 2 sprays into the nose daily., Disp: 1 Inhaler, Rfl: 12   Allergies  Allergen Reactions  . Codeine Nausea And Vomiting  . Tagamet [Cimetidine] Itching and Rash    Past Medical History:  Diagnosis Date  .  Asthma   . Bipolar disorder (HCC) 05/10/2016  . Seasonal allergies   . 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  Was on Phenobarbital at one time--perhaps moved her to depakote     Past Surgical History:  Procedure Laterality Date  . APPENDECTOMY    . OOPHORECTOMY     Not clear which side--per patient removed subsequent to TAH for ovarian cancer  . TOTAL ABDOMINAL HYSTERECTOMY     With unilateral oophorectomy    Family History  Problem Relation Age of Onset  . Hypertension Mother   . Thyroid disease Mother   . Diabetes Father   . Hypertension Father   . Heart disease Father   . Cancer Maternal Grandfather        colon  . Cancer Maternal Aunt        thyroid  . Cancer Maternal Uncle        thyroid  . Hypertension Paternal Uncle   . Asthma Brother     Social History   Tobacco Use  . Smoking status: Never Smoker  . Smokeless tobacco: Never Used  Substance Use Topics  . Alcohol use: Yes    Alcohol/week: 1.0 standard drink    Types: 1 Shots of liquor per week    Comment: occasional  . Drug use: No    Comment: History of cocaine abuse    ROS   Objective:   Vitals: BP 112/78 (BP Location: Right Arm)   Pulse (!) 111  Temp 98.1 F (36.7 C) (Oral)   Resp 19   SpO2 97%   Pulse was 98-100bpm.   Physical Exam Constitutional:      General: She is not in acute distress.    Appearance: Normal appearance. She is well-developed. She is not ill-appearing, toxic-appearing or diaphoretic.  HENT:     Head: Normocephalic and atraumatic.     Nose: Nose normal.     Mouth/Throat:     Mouth: Mucous membranes are moist.     Pharynx: Posterior oropharyngeal erythema present. No oropharyngeal exudate.  Eyes:     Extraocular Movements: Extraocular movements intact.     Pupils: Pupils are equal, round, and reactive to light.  Cardiovascular:     Rate and Rhythm: Normal rate and regular rhythm.     Pulses: Normal pulses.     Heart sounds: Normal heart sounds. No murmur  heard. No friction rub. No gallop.   Pulmonary:     Effort: Pulmonary effort is normal. No respiratory distress.     Breath sounds: Normal breath sounds. No stridor. No wheezing, rhonchi or rales.  Skin:    General: Skin is warm and dry.     Findings: No rash.  Neurological:     Mental Status: She is alert and oriented to person, place, and time.  Psychiatric:        Mood and Affect: Mood normal.        Behavior: Behavior normal.        Thought Content: Thought content normal.    Results for orders placed or performed during the hospital encounter of 09/28/20 (from the past 24 hour(s))  POCT Rapid Strep A     Status: None   Collection Time: 09/28/20 10:31 AM  Result Value Ref Range   Streptococcus, Group A Screen (Direct) NEGATIVE NEGATIVE    Assessment and Plan :   PDMP not reviewed this encounter.  1. Viral syndrome   2. Sore throat   3. Nasal congestion   4. Hoarseness of voice   5. Allergic rhinitis due to other allergic trigger, unspecified seasonality   6. Mild persistent asthma without complication     Suspect viral syndrome likely worsened by her history of allergic rhinitis and asthma.  Recommended prednisone course for this.  Strep test negative, culture pending.  Patient refused COVID-19 status.  Recommended supportive care otherwise. Counseled patient on potential for adverse effects with medications prescribed/recommended today, ER and return-to-clinic precautions discussed, patient verbalized understanding.    Wallis Bamberg, PA-C 09/28/20 1045

## 2020-09-28 NOTE — ED Triage Notes (Signed)
Pt presents with sore throat, loss of voice, and congestion xs 3 days.

## 2020-09-30 LAB — CULTURE, GROUP A STREP (THRC)

## 2021-01-12 IMAGING — CT CT ABD-PELV W/ CM
2 of 5 series · 15 of 46 positions shown, 17 images · IV contrast (omnipaque)
Comparison: 09/18/2013

CLINICAL DATA: Abdominal distention with nausea and vomiting.

EXAM:
CT ABDOMEN AND PELVIS WITH CONTRAST
TECHNIQUE: Multidetector CT imaging of the abdomen and pelvis was performed
using the standard protocol following bolus administration of
intravenous contrast.
CONTRAST:  100mL OMNIPAQUE IOHEXOL 300 MG/ML  SOLN

[Series 3: axial st · axial · 0.70mm/px · z∈[-894,-504]mm · 12 of 94 slices shown, 14 images]
[im 8/94  soft-tissue]
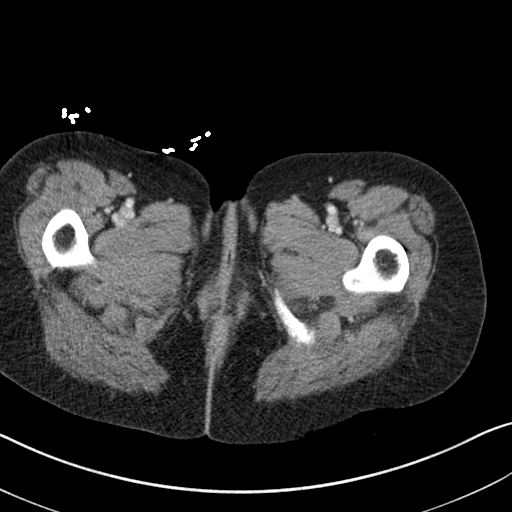
[im 8/94  bone]
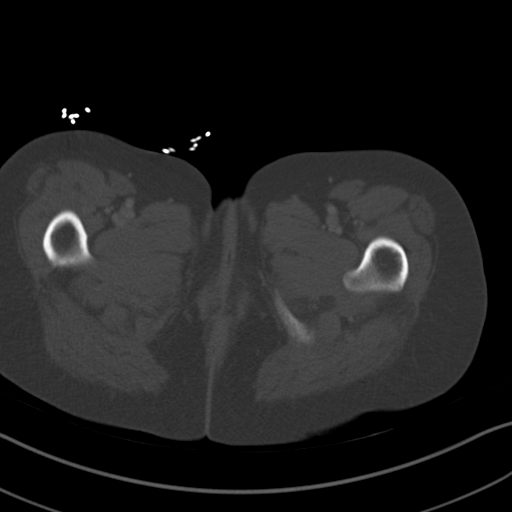
[im 15/94  soft-tissue]
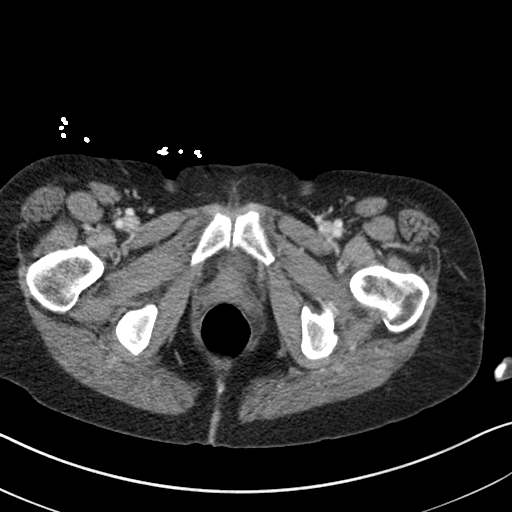
[im 22/94  soft-tissue]
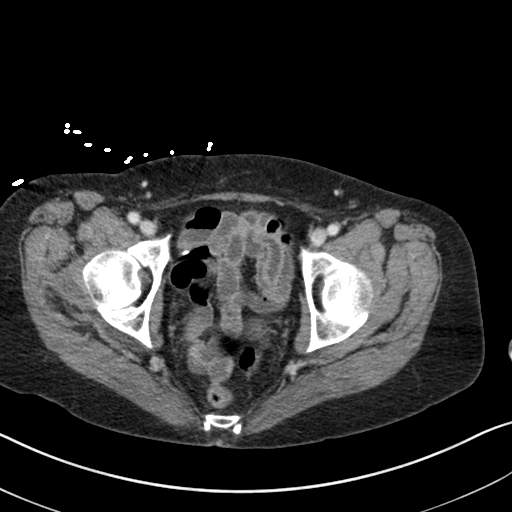
[im 29/94  soft-tissue]
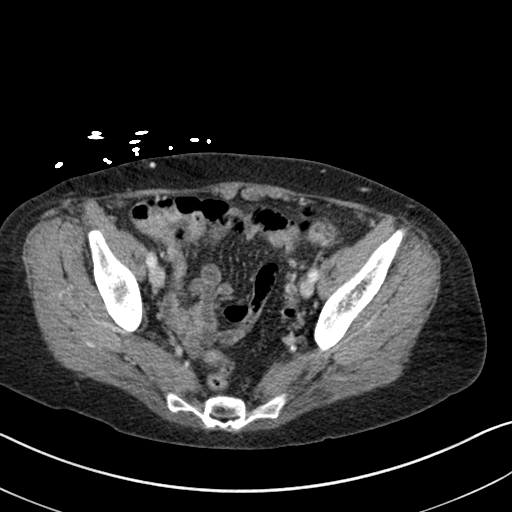
[im 36/94  soft-tissue]
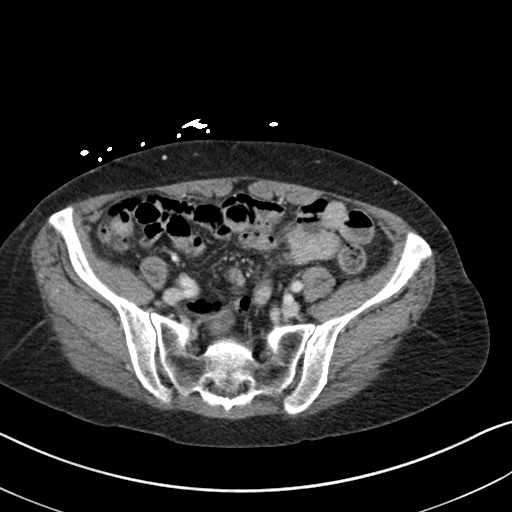
[im 43/94  soft-tissue]
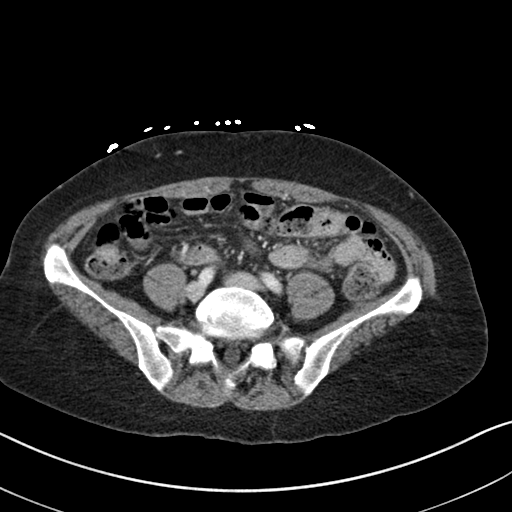
[im 51/94  soft-tissue]
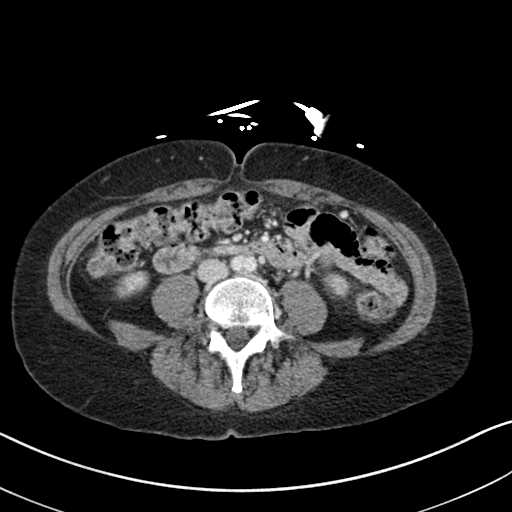
[im 58/94  soft-tissue]
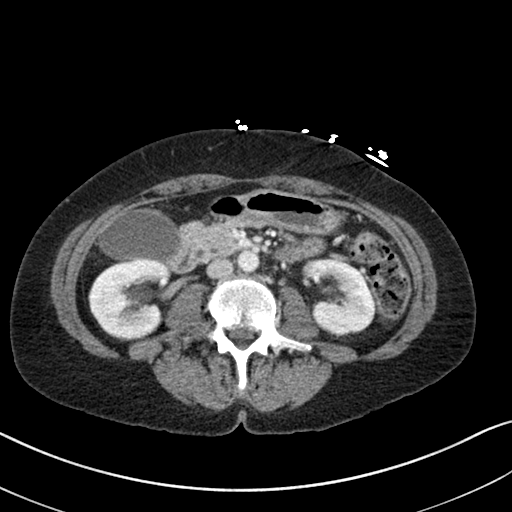
[im 65/94  soft-tissue]
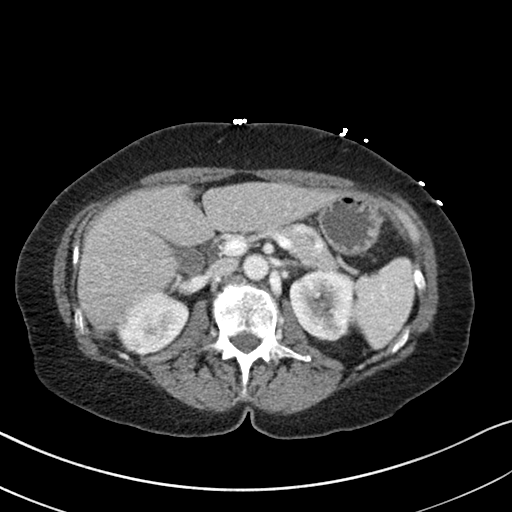
[im 65/94  bone]
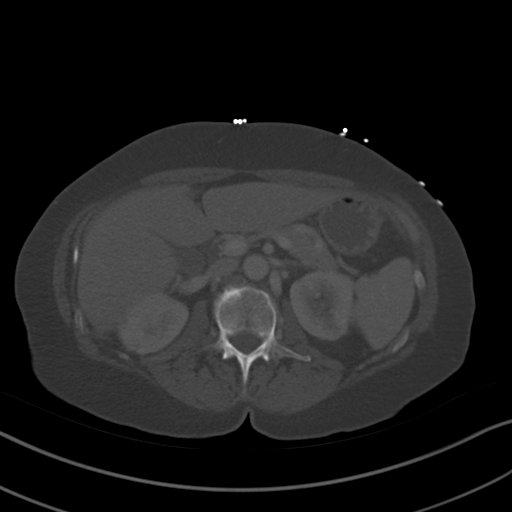
[im 72/94  soft-tissue]
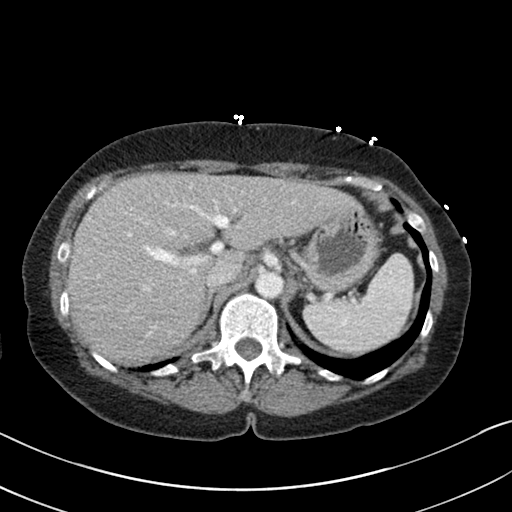
[im 79/94  soft-tissue]
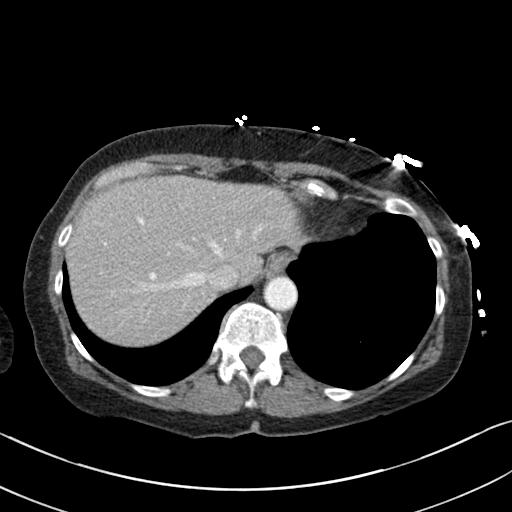
[im 86/94  soft-tissue]
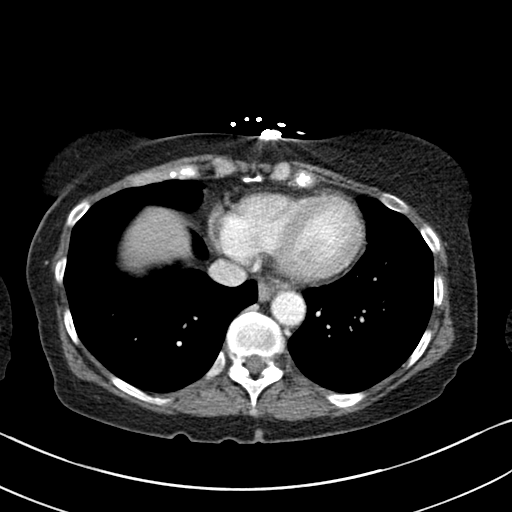

[Series 6: coronal st · coronal · 0.70mm/px · 3 of 116 slices shown]
[im 39/116  soft-tissue]
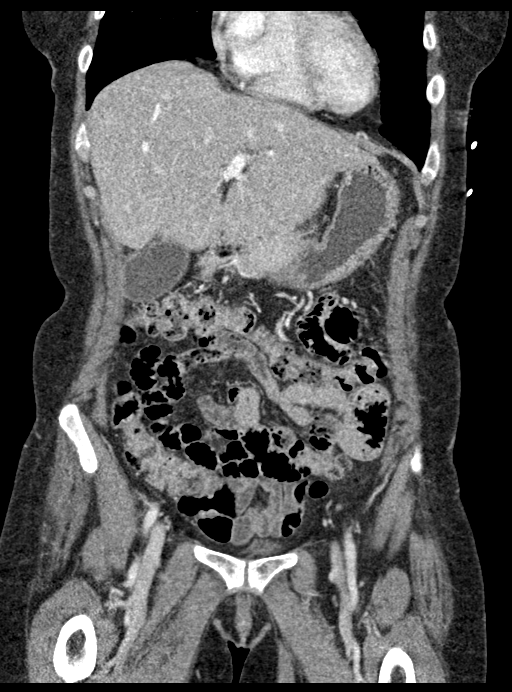
[im 52/116  soft-tissue]
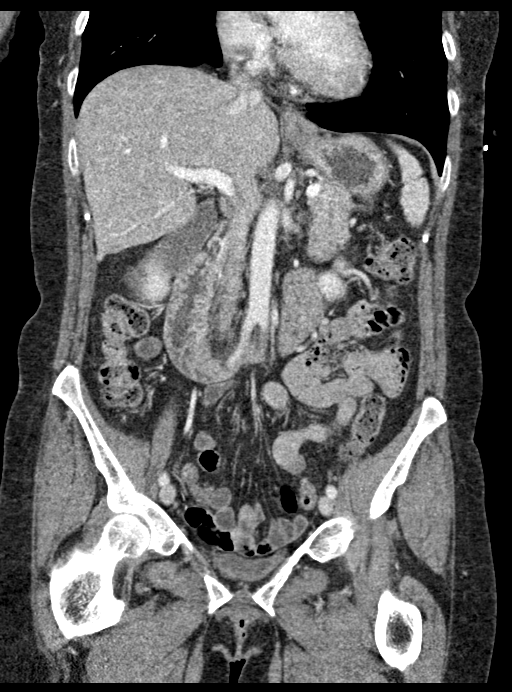
[im 64/116  soft-tissue]
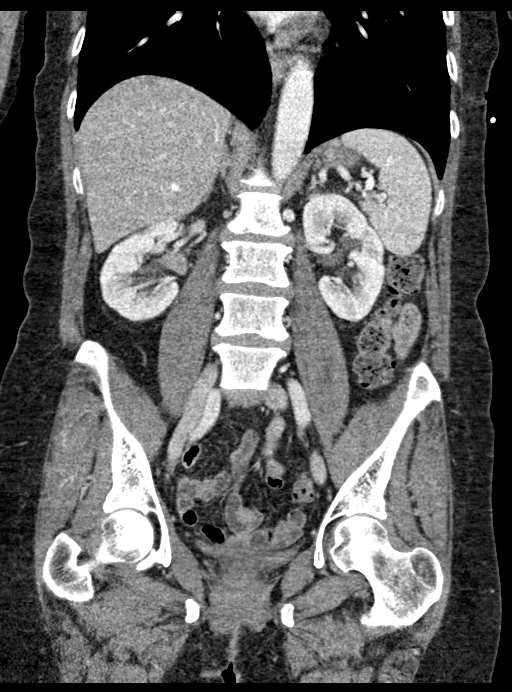

[15 of 46 positions shown; findings below may reference images not displayed]

FINDINGS: Lower chest:  No contributory findings.

Hepatobiliary: No focal liver abnormality.No evidence of biliary
obstruction or stone.

Pancreas: Unremarkable.

Spleen: Unremarkable.

Adrenals/Urinary Tract: Negative adrenals. No hydronephrosis or
stone. Mild cortical scarring at the upper pole left kidney.
Unremarkable bladder.

Stomach/Bowel: No obstruction. Appendectomy. No evidence of bowel
inflammation. Sigmoid diverticulosis.

Vascular/Lymphatic: No acute vascular abnormality. No mass or
adenopathy.

Reproductive:Hysterectomy.

Other: No ascites or pneumoperitoneum.

Musculoskeletal: No acute abnormalities.
IMPRESSION: 1. No acute finding or explanation for symptoms.
2. Sigmoid diverticulosis.

## 2021-01-12 IMAGING — CT CT MAXILLOFACIAL W/ CM
3 of 4 series · 15 of 47 positions shown, 18 images · IV contrast (omnipaque)
Comparison: Brain MRI 12/20/2019

CLINICAL DATA: Right-sided face pain from nose to chin. History of
face surgery many years ago

EXAM:
CT MAXILLOFACIAL WITH CONTRAST
TECHNIQUE: Multidetector CT imaging of the maxillofacial structures was
performed with intravenous contrast. Multiplanar CT image
reconstructions were also generated.
CONTRAST:  100mL OMNIPAQUE IOHEXOL 300 MG/ML  SOLN

[Series 9: max soft · axial · 0.31mm/px · z∈[-266,-140]mm · 10 of 75 slices shown, 13 images]
[im 6/75  brain]
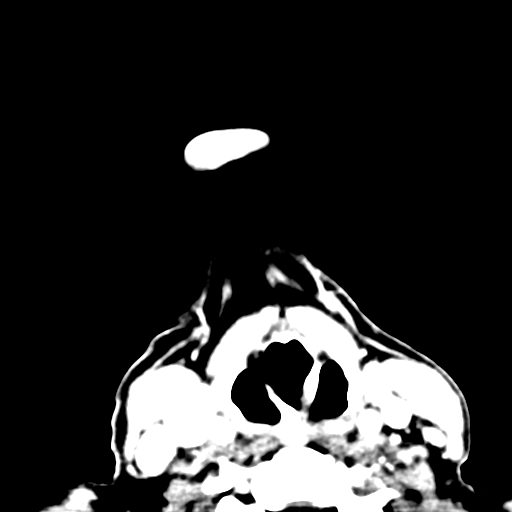
[im 6/75  bone]
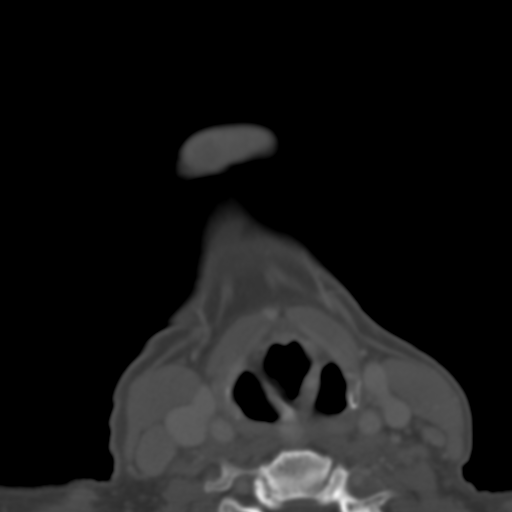
[im 13/75  bone]
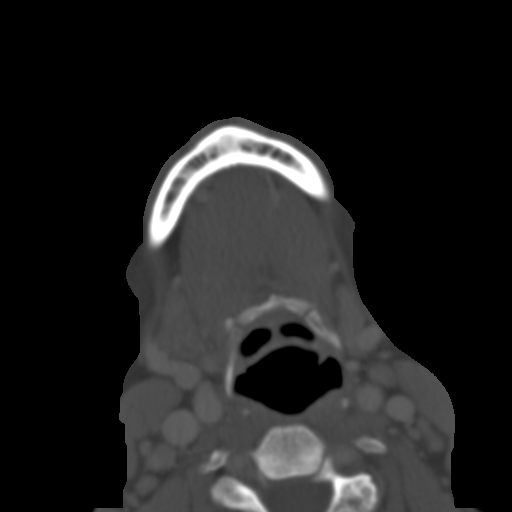
[im 21/75  bone]
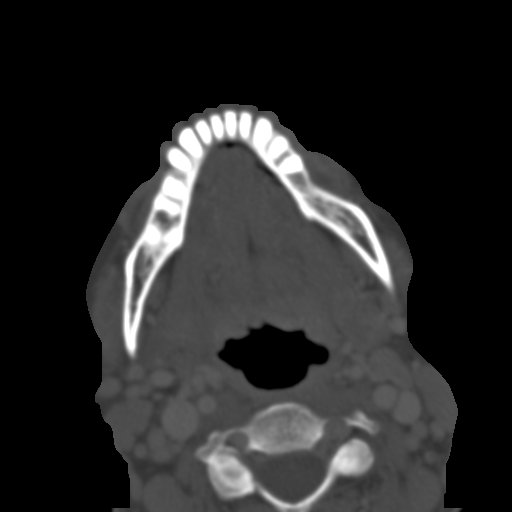
[im 26/75  bone]
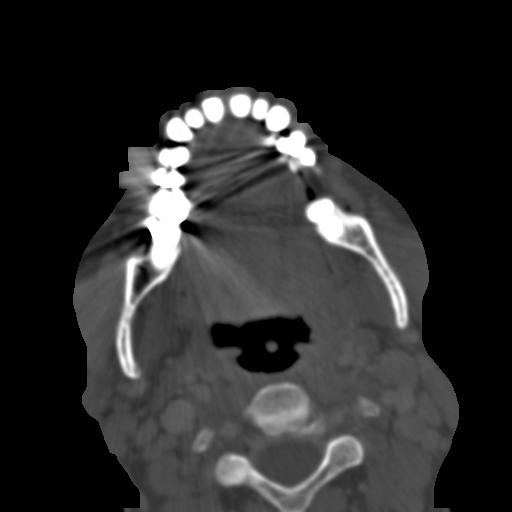
[im 34/75  brain]
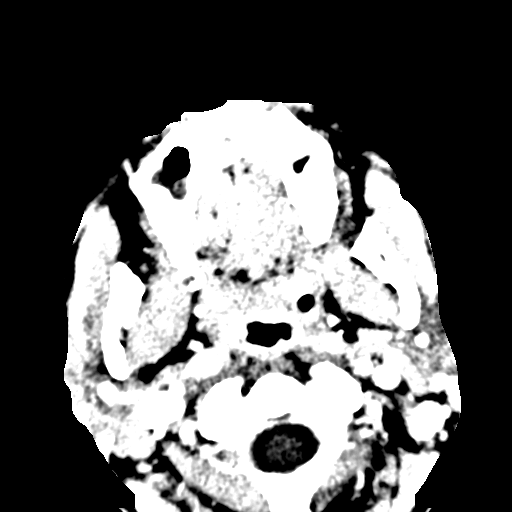
[im 34/75  bone]
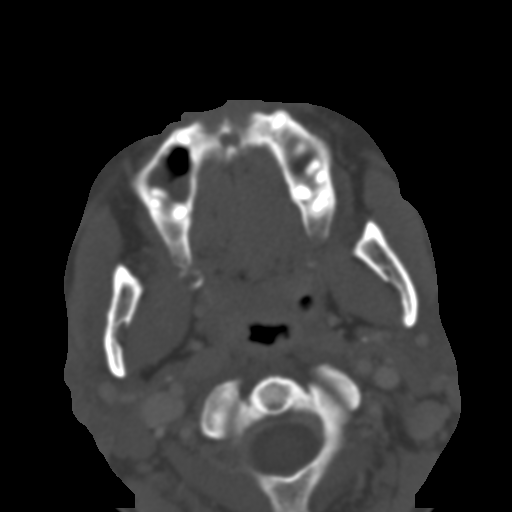
[im 41/75  bone]
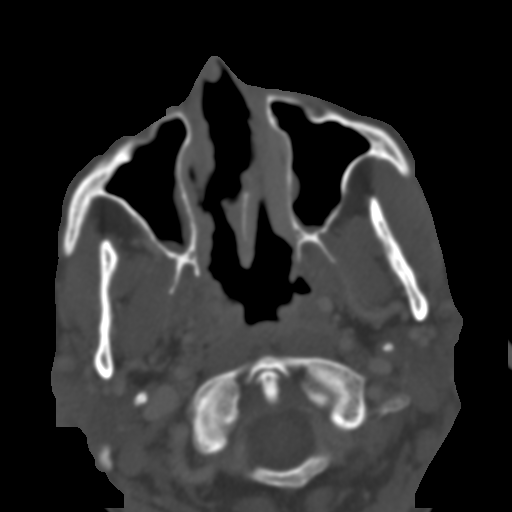
[im 49/75  bone]
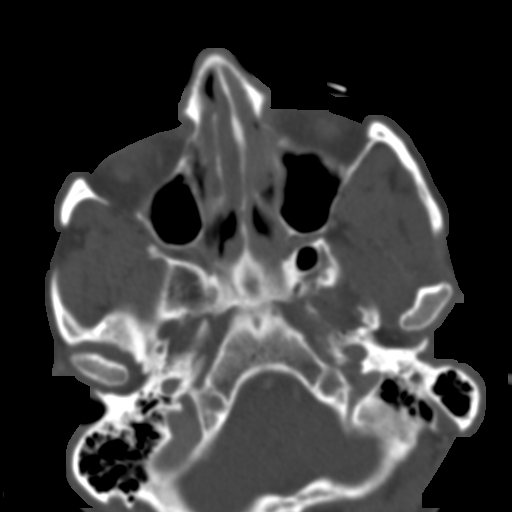
[im 57/75  bone]
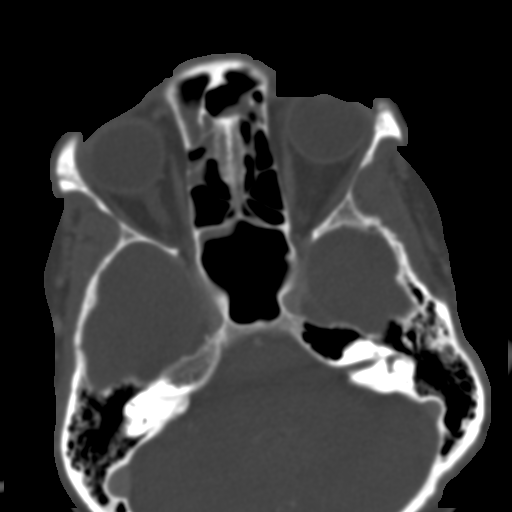
[im 62/75  brain]
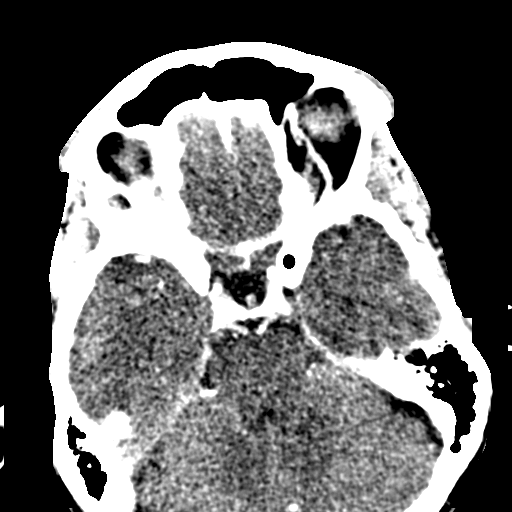
[im 62/75  bone]
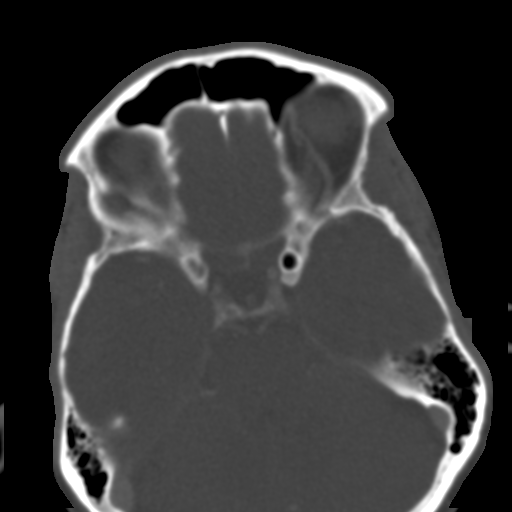
[im 69/75  bone]
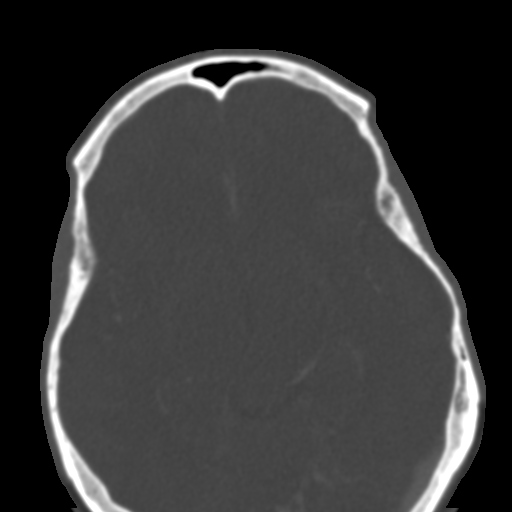

[Series 13: coronal soft · coronal · 0.32mm/px · 3 of 70 slices shown]
[im 24/70  bone]
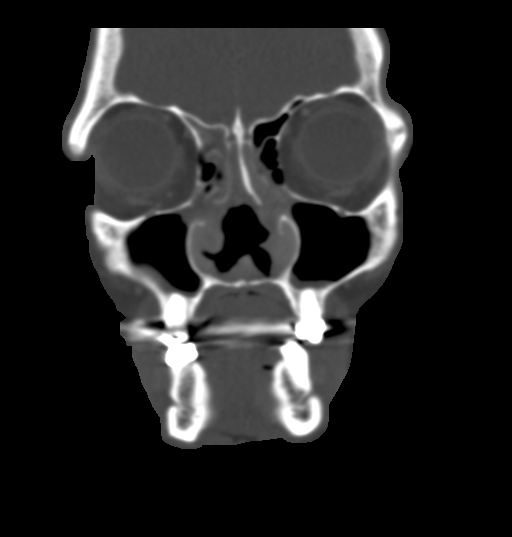
[im 31/70  bone]
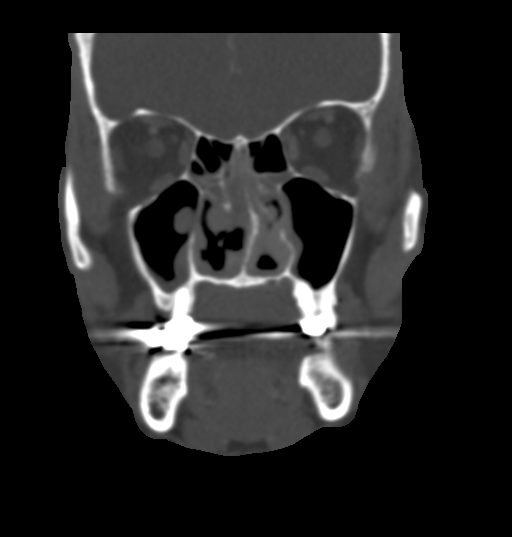
[im 39/70  bone]
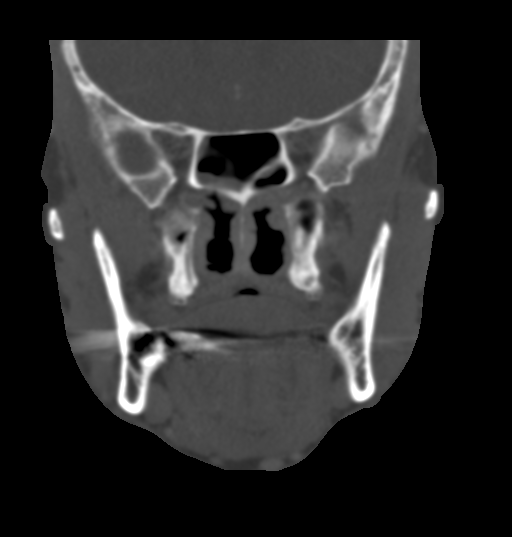

[Series 16: sagittal bone · sagittal · 0.27mm/px · 2 of 78 slices shown]
[im 26/78  bone]
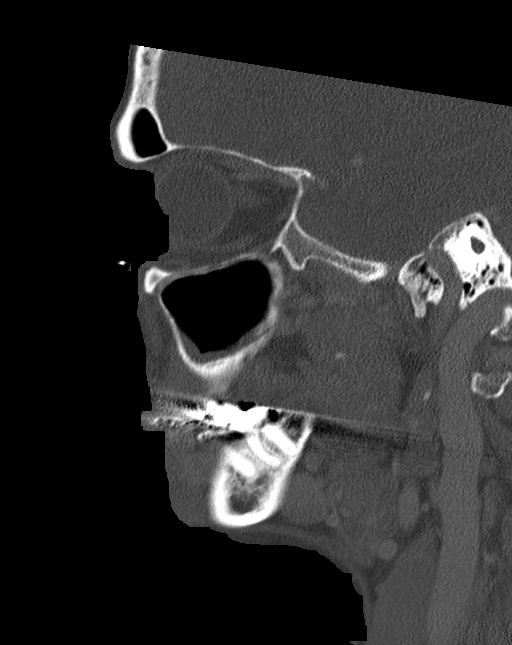
[im 52/78  bone]
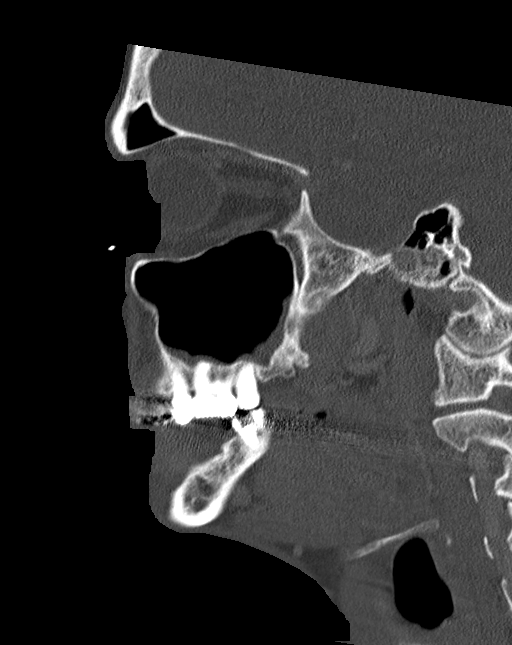

[15 of 47 positions shown; findings below may reference images not displayed]

FINDINGS: Osseous: No evidence of fracture or aggressive lesion. There is
cervical facet spurring which is partially covered.

Orbits: No evidence of mass or inflammation.

Sinuses: Patchy mucosal thickening throughout the sinuses. There is
thickening and increased enhancement within the nasal cavity which
shows large septal defect and bilateral turbinate resection or
erosion. The appearance is stable from 9472 and reportedly there is
history of nasal surgery, although findings are extensive and there
could be a superimposed inflammatory/granulomatous process. No
discrete mass or evidence of invasive sinus disease in periantral
soft tissues.

Soft tissues: No evidence of soft tissue inflammation to suggest
invasive sinus disease. No soft tissue mass is seen. Bilateral
lateral retropharyngeal lymph node enlargement and jugular chain
enlargement which was also partially covered on 9472 brain MRI. This
may be reactive to the patient's right of sinusitis. The right
lateral retropharyngeal lymph node measures up to 7 mm in thickness.

Limited intracranial: Negative
IMPRESSION: 1. Chronic rhinosinusitis with extensive septal defect and bilateral
turbinate erosion or resection. Given the extent of findings
consider chronic granulomatous disease.
2. Upper cervical lymphadenopathy also seen in 9472, presumably
reactive.
3. No acute finding.

## 2021-08-24 ENCOUNTER — Encounter (HOSPITAL_COMMUNITY): Payer: Self-pay

## 2021-08-24 ENCOUNTER — Other Ambulatory Visit: Payer: Self-pay

## 2021-08-24 ENCOUNTER — Ambulatory Visit (HOSPITAL_COMMUNITY)
Admission: EM | Admit: 2021-08-24 | Discharge: 2021-08-24 | Disposition: A | Discharge status: Home / Self Care | Payer: Self-pay | Attending: Student | Admitting: Student

## 2021-08-24 DIAGNOSIS — L03211 Cellulitis of face: Secondary | ICD-10-CM

## 2021-08-24 MED ORDER — SULFAMETHOXAZOLE-TRIMETHOPRIM 800-160 MG PO TABS: 1.0000 | ORAL_TABLET | Freq: Two times a day (BID) | ORAL | 0 refills | Status: AC

## 2021-08-24 NOTE — ED Provider Notes (Signed)
[facility]    CSN: [id number] Arrival date & time: 08/24/21  1301      History   Chief Complaint Chief Complaint  Patient presents with   Recurrent Skin Infections    HPI Mary Mccormick is a 66 y.o. female presenting with concern for skin infection- forehead, x3 days. Medical history substance abuse, preseptal cellulitis. States pain and swelling above her R eye x3 days. Denies trauma or abrasion but was doing cleaning around the house recently and possible exposure to mold. Denies fevers/chills, pain with eye movement, vision changes, discharge from the location.  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  Was on Phenobarbital at one time--perhaps moved her to depakote    Patient Active Problem List   Diagnosis Date Noted   Sinusitis 05/10/2016   History of cocaine abuse ([place]) 05/10/2016   Bipolar disorder ([place]) 05/10/2016   Asthma    Seasonal allergies    Seizure ([place])     Past Surgical History:  Procedure Laterality Date   APPENDECTOMY     OOPHORECTOMY     Not clear which side--per patient removed subsequent to TAH for ovarian cancer   TOTAL ABDOMINAL HYSTERECTOMY     With unilateral oophorectomy    OB History     Gravida  3   Para  2   Term  2   Preterm      AB  1   Living  2      SAB      IAB      Ectopic      Multiple      Live Births               Home Medications    Prior to Admission medications   Medication Sig Start Date End Date Taking? Authorizing Provider  sulfamethoxazole-trimethoprim (BACTRIM DS) 800-160 MG tablet Take 1 tablet by mouth 2 (two) times daily for 7 days. 08/24/21 08/31/21 Yes Hazel Sams, PA-C  acetaminophen (TYLENOL) 500 MG tablet Take 1,000 mg by mouth every 6 (six) hours as needed for mild pain.    [provider]  albuterol (PROVENTIL HFA;VENTOLIN HFA) 108 (90  Base) MCG/ACT inhaler Inhale 1-2 puffs into the lungs every 6 (six) hours as needed for wheezing or shortness of breath. 01/31/18   Wieters, Hallie C, PA-C  fexofenadine-pseudoephedrine (ALLEGRA-D) 60-120 MG 12 hr tablet Take 1 tablet by mouth every 12 (twelve) hours. Patient taking differently: Take 1 tablet by mouth as needed (allergies). 10/07/19   Orpah Greek, MD  hydrOXYzine (ATARAX/VISTARIL) 25 MG tablet Take 0.5-1 tablets (12.5-25 mg total) by mouth every 8 (eight) hours as needed for itching. 08/04/20   Jaynee Eagles, PA-C  ibuprofen (ADVIL,MOTRIN) 200 MG tablet Take 600 mg by mouth every 6 (six) hours as needed for moderate pain.    [provider]  loratadine (CLARITIN) 10 MG tablet Take 10 mg by mouth daily.    [provider]  Multiple Vitamin (MULTIVITAMIN WITH MINERALS) TABS tablet Take 1 tablet by mouth daily.    [provider]  naproxen (NAPROSYN) 500 MG tablet Take 1 tablet (500 mg total) by mouth 2 (two) times daily with a meal. 08/04/20   Jaynee Eagles, PA-C  predniSONE (DELTASONE) 50 MG tablet Take 1 tablet (50 mg total) by mouth daily with  breakfast. 09/28/20   Jaynee Eagles, PA-C  promethazine-dextromethorphan (PROMETHAZINE-DM) 6.25-15 MG/5ML syrup Take 5 mLs by mouth at bedtime as needed for cough. 09/28/20   Jaynee Eagles, PA-C  triamcinolone (NASACORT) 55 MCG/ACT AERO nasal inhaler Place 2 sprays into the nose daily. 10/07/19   Orpah Greek, MD  cetirizine (ZYRTEC ALLERGY) 10 MG tablet Take 1 tablet (10 mg total) by mouth daily. Patient not taking: Reported on 10/06/2019 09/19/19 10/07/19  Darr, Edison Nasuti, PA-C  fluticasone [facility]) 50 MCG/ACT nasal spray Place 1 spray into both nostrils daily. Patient not taking: Reported on 10/06/2019 09/19/19 10/07/19  Darr, Edison Nasuti, PA-C    Family History Family History  Problem Relation Age of Onset   Hypertension Mother    Thyroid disease Mother    Diabetes Father    Hypertension Father    Heart disease Father     Cancer Maternal Grandfather        colon   Cancer Maternal Aunt        thyroid   Cancer Maternal Uncle        thyroid   Hypertension Paternal Uncle    Asthma Brother     Social History Social History   Tobacco Use   Smoking status: Never   Smokeless tobacco: Never  Substance Use Topics   Alcohol use: Yes    Alcohol/week: 1.0 standard drink    Types: 1 Shots of liquor per week    Comment: occasional   Drug use: No    Comment: History of cocaine abuse     Allergies   Codeine and Tagamet [cimetidine]   Review of Systems Review of Systems  Skin:  Positive for color change. Negative for wound.  All other systems reviewed and are negative.   Physical Exam Triage Vital Signs ED Triage Vitals  Enc Vitals Group     BP      Pulse      Resp      Temp      Temp src      SpO2      Weight      Height      Head Circumference      Peak Flow      Pain Score      Pain Loc      Pain Edu?      Excl. in [place]?    No data found.  Updated Vital Signs BP (!) 162/88 (BP Location: Left Arm)    Pulse 85    Temp 98.2 F (36.8 C) (Oral)    Resp 18    SpO2 97%   Visual Acuity Right Eye Distance:   Left Eye Distance:   Bilateral Distance:    Right Eye Near:   Left Eye Near:    Bilateral Near:     Physical Exam Vitals reviewed.  Constitutional:      General: She is not in acute distress.    Appearance: Normal appearance. She is not ill-appearing or diaphoretic.  HENT:     Head: Normocephalic and atraumatic.  Eyes:     General: Lids are normal. Vision grossly intact. Gaze aligned appropriately. No allergic shiner, visual field deficit or scleral icterus.       Right eye: No foreign body, discharge or hordeolum.        Left eye: No foreign body, discharge or hordeolum.     Extraocular Movements: Extraocular movements intact.     Right eye: Normal extraocular motion and no nystagmus.  Left eye: Normal extraocular motion and no nystagmus.     Conjunctiva/sclera:      Right eye: Right conjunctiva is not injected. No chemosis, exudate or hemorrhage.    Left eye: Left conjunctiva is not injected. No chemosis, exudate or hemorrhage. Cardiovascular:     Rate and Rhythm: Normal rate and regular rhythm.     Heart sounds: Normal heart sounds.  Pulmonary:     Effort: Pulmonary effort is normal.     Breath sounds: Normal breath sounds.  Skin:    General: Skin is warm.     Comments: See image below.  R forehead with 1.5cm area of induration and tenderness. There is some tenderness distally extending to the eyebrow/orbit, but no skin changes or swelling. EOMIs intact and without pain. No eyelid changes. No drainage.   Neurological:     General: No focal deficit present.     Mental Status: She is alert and oriented to person, place, and time.  Psychiatric:        Mood and Affect: Mood normal.        Behavior: Behavior normal.        Thought Content: Thought content normal.        Judgment: Judgment normal.      UC Treatments / Results  Labs (all labs ordered are listed, but only abnormal results are displayed) Labs Reviewed - No data to display  EKG   Radiology No results found.  Procedures Procedures (including critical care time)  Medications Ordered in UC Medications - No data to display  Initial Impression / Assessment and Plan / UC Course  I have reviewed the triage vital signs and the nursing notes.  Pertinent labs & imaging results that were available during my care of the patient were reviewed by me and considered in my medical decision making (see chart for details).     This patient is a very pleasant 66 y.o. year old female presenting with cellulitis - forehead. Afebrile, nontachy. Abscess is immature. Bactrim sent, stop using hydrogen peroxide to cleanse. Strict ED precautions discussed - symptoms worsen, head to ED.   Final Clinical Impressions(s) / UC Diagnoses   Final diagnoses:  Cellulitis of forehead     Discharge  Instructions      -Start the antibiotic-Bactrim (trimethoprim-sulfamethoxazole) twice daily x7 days. Take with food if you have a sensitive stomach. -Wash with gentle soap and water only! -If the pain or swelling is getting worse, or new symptoms like pain with eye movement or vision changes - head straight to the ED.     ED Prescriptions     Medication Sig Dispense Auth. Provider   sulfamethoxazole-trimethoprim (BACTRIM DS) 800-160 MG tablet Take 1 tablet by mouth 2 (two) times daily for 7 days. 14 tablet Hazel Sams, PA-C      PDMP not reviewed this encounter.   Hazel Sams, PA-C 08/24/21 1439

## 2021-08-24 NOTE — ED Triage Notes (Signed)
Pt reports itching rash in the forehead x 3 days.

## 2021-08-24 NOTE — Discharge Instructions (Addendum)
-  Start the antibiotic-Bactrim (trimethoprim-sulfamethoxazole) twice daily x7 days. Take with food if you have a sensitive stomach. -Wash with gentle soap and water only! -If the pain or swelling is getting worse, or new symptoms like pain with eye movement or vision changes - head straight to the ED.

## 2022-04-18 ENCOUNTER — Encounter (HOSPITAL_COMMUNITY): Payer: Self-pay

## 2022-04-18 ENCOUNTER — Other Ambulatory Visit: Payer: Self-pay

## 2022-04-18 ENCOUNTER — Emergency Department (HOSPITAL_COMMUNITY)
Admission: EM | Admit: 2022-04-18 | Discharge: 2022-04-18 | Discharge status: Against Medical Advice | Payer: Medicare PPO | Attending: Emergency Medicine | Admitting: Emergency Medicine

## 2022-04-18 DIAGNOSIS — R0981 Nasal congestion: Secondary | ICD-10-CM | POA: Diagnosis present

## 2022-04-18 DIAGNOSIS — J3489 Other specified disorders of nose and nasal sinuses: Secondary | ICD-10-CM | POA: Diagnosis not present

## 2022-04-18 DIAGNOSIS — Z5321 Procedure and treatment not carried out due to patient leaving prior to being seen by health care provider: Secondary | ICD-10-CM | POA: Insufficient documentation

## 2022-04-18 NOTE — ED Notes (Signed)
Patient states the wait is too long and she is leaving 
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
## 2022-04-18 NOTE — ED Triage Notes (Signed)
Patient complains of congestion and ears feel full and frontal headache. Patient was sent from [facility] for further evaluation

## 2023-04-17 ENCOUNTER — Emergency Department (HOSPITAL_COMMUNITY): Payer: Medicare PPO

## 2023-04-17 ENCOUNTER — Emergency Department (HOSPITAL_COMMUNITY)
Admission: EM | Admit: 2023-04-17 | Discharge: 2023-04-17 | Disposition: A | Discharge status: Home / Self Care | Payer: Medicare PPO | Attending: Emergency Medicine | Admitting: Emergency Medicine

## 2023-04-17 ENCOUNTER — Other Ambulatory Visit: Payer: Self-pay

## 2023-04-17 DIAGNOSIS — K5730 Diverticulosis of large intestine without perforation or abscess without bleeding: Secondary | ICD-10-CM | POA: Diagnosis not present

## 2023-04-17 DIAGNOSIS — Z7952 Long term (current) use of systemic steroids: Secondary | ICD-10-CM | POA: Insufficient documentation

## 2023-04-17 DIAGNOSIS — S82121A Displaced fracture of lateral condyle of right tibia, initial encounter for closed fracture: Secondary | ICD-10-CM

## 2023-04-17 DIAGNOSIS — R911 Solitary pulmonary nodule: Secondary | ICD-10-CM | POA: Diagnosis not present

## 2023-04-17 DIAGNOSIS — M79642 Pain in left hand: Secondary | ICD-10-CM | POA: Insufficient documentation

## 2023-04-17 DIAGNOSIS — J45909 Unspecified asthma, uncomplicated: Secondary | ICD-10-CM | POA: Insufficient documentation

## 2023-04-17 DIAGNOSIS — Z23 Encounter for immunization: Secondary | ICD-10-CM | POA: Insufficient documentation

## 2023-04-17 DIAGNOSIS — M79641 Pain in right hand: Secondary | ICD-10-CM | POA: Insufficient documentation

## 2023-04-17 DIAGNOSIS — Y92410 Unspecified street and highway as the place of occurrence of the external cause: Secondary | ICD-10-CM | POA: Insufficient documentation

## 2023-04-17 DIAGNOSIS — K8020 Calculus of gallbladder without cholecystitis without obstruction: Secondary | ICD-10-CM | POA: Insufficient documentation

## 2023-04-17 DIAGNOSIS — S0990XA Unspecified injury of head, initial encounter: Secondary | ICD-10-CM | POA: Diagnosis not present

## 2023-04-17 DIAGNOSIS — S8991XA Unspecified injury of right lower leg, initial encounter: Secondary | ICD-10-CM | POA: Diagnosis present

## 2023-04-17 LAB — CBC
HCT: 40.9 % (ref 36.0–46.0)
Hemoglobin: 12.5 g/dL (ref 12.0–15.0)
MCH: 27.6 pg (ref 26.0–34.0)
MCHC: 30.6 g/dL (ref 30.0–36.0)
MCV: 90.3 fL (ref 80.0–100.0)
Platelets: 356 10*3/uL (ref 150–400)
RBC: 4.53 MIL/uL (ref 3.87–5.11)
RDW: 12.4 % (ref 11.5–15.5)
WBC: 10.1 10*3/uL (ref 4.0–10.5)
nRBC: 0 % (ref 0.0–0.2)

## 2023-04-17 LAB — URINALYSIS, ROUTINE W REFLEX MICROSCOPIC
Bilirubin Urine: NEGATIVE
Glucose, UA: NEGATIVE mg/dL
Hgb urine dipstick: NEGATIVE
Ketones, ur: NEGATIVE mg/dL
Leukocytes,Ua: NEGATIVE
Nitrite: NEGATIVE
Protein, ur: NEGATIVE mg/dL
Specific Gravity, Urine: 1.02 (ref 1.005–1.030)
pH: 7 (ref 5.0–8.0)

## 2023-04-17 LAB — I-STAT CHEM 8, ED
BUN: 17 mg/dL (ref 8–23)
Calcium, Ion: 1.17 mmol/L (ref 1.15–1.40)
Chloride: 106 mmol/L (ref 98–111)
Creatinine, Ser: 0.8 mg/dL (ref 0.44–1.00)
Glucose, Bld: 89 mg/dL (ref 70–99)
HCT: 39 % (ref 36.0–46.0)
Hemoglobin: 13.3 g/dL (ref 12.0–15.0)
Potassium: 3.9 mmol/L (ref 3.5–5.1)
Sodium: 140 mmol/L (ref 135–145)
TCO2: 25 mmol/L (ref 22–32)

## 2023-04-17 LAB — COMPREHENSIVE METABOLIC PANEL
ALT: 17 U/L (ref 0–44)
AST: 29 U/L (ref 15–41)
Albumin: 3.6 g/dL (ref 3.5–5.0)
Alkaline Phosphatase: 55 U/L (ref 38–126)
Anion gap: 12 (ref 5–15)
BUN: 15 mg/dL (ref 8–23)
CO2: 21 mmol/L — ABNORMAL LOW (ref 22–32)
Calcium: 9 mg/dL (ref 8.9–10.3)
Chloride: 105 mmol/L (ref 98–111)
Creatinine, Ser: 0.82 mg/dL (ref 0.44–1.00)
GFR, Estimated: >60 mL/min (ref >60)
Glucose, Bld: 89 mg/dL (ref 70–99)
Potassium: 3.9 mmol/L (ref 3.5–5.1)
Sodium: 138 mmol/L (ref 135–145)
Total Bilirubin: 0.4 mg/dL (ref 0.3–1.2)
Total Protein: 6.4 g/dL — ABNORMAL LOW (ref 6.5–8.1)

## 2023-04-17 LAB — I-STAT CG4 LACTIC ACID, ED: Lactic Acid, Venous: 1.7 mmol/L (ref 0.5–1.9)

## 2023-04-17 LAB — CK: Total CK: 82 U/L (ref 38–234)

## 2023-04-17 LAB — RAPID URINE DRUG SCREEN, HOSP PERFORMED
Amphetamines: NOT DETECTED
Barbiturates: NOT DETECTED
Benzodiazepines: NOT DETECTED
Cocaine: POSITIVE — AB
Opiates: NOT DETECTED
Tetrahydrocannabinol: NOT DETECTED

## 2023-04-17 LAB — ETHANOL: Alcohol, Ethyl (B): <10 mg/dL (ref <10)

## 2023-04-17 LAB — MAGNESIUM: Magnesium: 2 mg/dL (ref 1.7–2.4)

## 2023-04-17 MED ORDER — TETANUS-DIPHTH-ACELL PERTUSSIS 5-2.5-18.5 LF-MCG/0.5 IM SUSY
0.5000 mL | PREFILLED_SYRINGE | Freq: Once | INTRAMUSCULAR | Status: AC
  Administered 2023-04-17: 0.5 mL via INTRAMUSCULAR
  Filled 2023-04-17: qty 0.5

## 2023-04-17 MED ORDER — FENTANYL CITRATE PF 50 MCG/ML IJ SOSY: 50.0000 ug | PREFILLED_SYRINGE | Freq: Once | INTRAMUSCULAR | Status: DC

## 2023-04-17 MED ORDER — KETOROLAC TROMETHAMINE 15 MG/ML IJ SOLN
15.0000 mg | Freq: Once | INTRAMUSCULAR | Status: AC
  Administered 2023-04-17: 15 mg via INTRAVENOUS
  Filled 2023-04-17: qty 1

## 2023-04-17 MED ORDER — OXYCODONE-ACETAMINOPHEN 5-325 MG PO TABS
1.0000 | ORAL_TABLET | Freq: Four times a day (QID) | ORAL | 0 refills | Status: AC | PRN
Start: 2023-04-17 — End: 2023-04-22

## 2023-04-17 MED ORDER — LACTATED RINGERS IV BOLUS
500.0000 mL | Freq: Once | INTRAVENOUS | Status: AC
  Administered 2023-04-17: 500 mL via INTRAVENOUS

## 2023-04-17 MED ORDER — IOHEXOL 350 MG/ML SOLN
75.0000 mL | Freq: Once | INTRAVENOUS | Status: AC | PRN
  Administered 2023-04-17: 75 mL via INTRAVENOUS

## 2023-04-17 MED ORDER — OXYCODONE-ACETAMINOPHEN 5-325 MG PO TABS
1.0000 | ORAL_TABLET | Freq: Once | ORAL | Status: AC
  Administered 2023-04-17: 1 via ORAL
  Filled 2023-04-17: qty 1
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
## 2023-04-17 NOTE — ED Notes (Signed)
CT unable to take patient due to labs not being resulted; mini lab to run istats.

## 2023-04-17 NOTE — ED Notes (Signed)
Patient transported to/from CT by RN on continuous cardiac and pulse oximetry monitoring with c-spine precautions maintained.

## 2023-04-17 NOTE — ED Notes (Signed)
Ortho tech contacted for knee immobilizer.
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
## 2023-04-17 NOTE — ED Notes (Signed)
Pt removed c-collar herself. Pt moving all extremities.

## 2023-04-17 NOTE — Progress Notes (Signed)
Orthopedic Tech Progress Note Patient Details:  Mary Mccormick 05/23/1956 [id number]  Ortho Devices Type of Ortho Device: Knee Immobilizer Ortho Device/Splint Location: RLE Ortho Device/Splint Interventions: Ordered, Application   Post Interventions Patient Tolerated: Well Instructions Provided: Adjustment of device, Care of device  Andilyn Bettcher A Norman Bier 04/17/2023, 5:58 PM

## 2023-04-17 NOTE — Discharge Instructions (Signed)
For pain, take ibuprofen and Tylenol.  A prescription for narcotic pain medication was sent to your pharmacy.  Take only as needed.  Do not put weight on your right leg.  Call the telephone number below to set up a follow-up appointment with orthopedic surgery.  Return to the emergency department for any new or worsening symptoms of concern.
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
## 2023-04-17 NOTE — ED Notes (Signed)
Pt visitor requested a straw for a drink. This NT asked visitor if it was for the patient and he replied yes. Pt reminded that she cannot have anything to eat or drink until tests are completed and resulted.
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
## 2023-04-19 ENCOUNTER — Ambulatory Visit: Payer: Medicare PPO | Admitting: Orthopaedic Surgery

## 2023-04-19 DIAGNOSIS — S82141A Displaced bicondylar fracture of right tibia, initial encounter for closed fracture: Secondary | ICD-10-CM | POA: Insufficient documentation

## 2023-04-19 HISTORY — DX: Displaced bicondylar fracture of right tibia, initial encounter for closed fracture: S82.141A

## 2023-04-19 NOTE — Progress Notes (Addendum)
Office Visit Note   Patient: Mary Mccormick           Date of Birth: 23-Oct-1955           MRN: [id number] Visit Date: 04/19/2023              Requested by: No referring provider defined for this encounter. PCP: Patient, No Pcp Per   Assessment & Plan: Visit Diagnoses:  1. Closed bicondylar fracture of right tibial plateau     Plan: Mary Mccormick is a 68 year old female with a bicondylar right tibial plateau fracture indicated for ORIF.  Imaging reviewed with the patient and details of the surgery including risk benefits prognosis reviewed with the patient.  Will plan on doing the surgery this coming Monday to allow the swelling to improve further.  68-month handicap placard provided.  We discussed the possible risks of having cocaine in her system while under anesthesia.  From my standpoint this is not an elective surgery.  She understands that she should abstain from any cocaine from now until surgery.  We will plan to admit postoperatively for pain control and mobilization with PT.  Questions encouraged and answered.    Follow-Up Instructions: No follow-ups on file.   Orders:  No orders of the defined types were placed in this encounter.  No orders of the defined types were placed in this encounter.     Procedures: No procedures performed   Clinical Data: No additional findings.   Subjective: Chief Complaint  Patient presents with   Right Leg - Pain    HPI Mary Mccormick is here for ED follow-up for right tibial plateau fracture.  Her husband is with her. Review of Systems  Constitutional: Negative.   HENT: Negative.    Eyes: Negative.   Respiratory: Negative.    Cardiovascular: Negative.   Endocrine: Negative.   Musculoskeletal: Negative.   Neurological: Negative.   Hematological: Negative.   Psychiatric/Behavioral: Negative.    All other systems reviewed and are negative.    Objective: Vital Signs: There were no vitals taken for this visit.  Physical  Exam Vitals and nursing note reviewed.  Constitutional:      Appearance: She is well-developed.  HENT:     Head: Atraumatic.     Nose: Nose normal.  Eyes:     Extraocular Movements: Extraocular movements intact.  Cardiovascular:     Pulses: Normal pulses.  Pulmonary:     Effort: Pulmonary effort is normal.  Abdominal:     Palpations: Abdomen is soft.  Musculoskeletal:     Cervical back: Neck supple.  Skin:    General: Skin is warm.     Capillary Refill: Capillary refill takes less than 2 seconds.  Neurological:     Mental Status: She is alert. Mental status is at baseline.  Psychiatric:        Behavior: Behavior normal.        Thought Content: Thought content normal.        Judgment: Judgment normal.     Ortho Exam Examination of the right lower extremity shows mild swelling.  Compartments are soft.  No signs of compartment syndrome.  Joint effusion is present. Specialty Comments:  No specialty comments available.  Imaging: No results found.   PMFS History: Patient Active Problem List   Diagnosis Date Noted   Closed bicondylar fracture of right tibial plateau 04/19/2023   Sinusitis 05/10/2016   History of cocaine abuse (HCC) 05/10/2016   Bipolar disorder (HCC) 05/10/2016  Asthma    Seasonal allergies    Seizure [facility])    Past Medical History:  Diagnosis Date   Asthma    Bipolar disorder (HCC) 05/10/2016   Seasonal allergies    Seizure (HCC)    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  Was on Phenobarbital at one time--perhaps moved her to depakote    Family History  Problem Relation Age of Onset   Hypertension Mother    Thyroid disease Mother    Diabetes Father    Hypertension Father    Heart disease Father    Cancer Maternal Grandfather        colon   Cancer Maternal Aunt        thyroid   Cancer Maternal Uncle        thyroid   Hypertension Paternal Uncle    Asthma Brother     Past Surgical History:  Procedure Laterality Date   APPENDECTOMY     OOPHORECTOMY      Not clear which side--per patient removed subsequent to TAH for ovarian cancer   TOTAL ABDOMINAL HYSTERECTOMY     With unilateral oophorectomy   Social History   Occupational History   Occupation: unemployed  Tobacco Use   Smoking status: Never   Smokeless tobacco: Never  Substance and Sexual Activity   Alcohol use: Yes    Alcohol/week: 1.0 standard drink of alcohol    Types: 1 Shots of liquor per week    Comment: occasional   Drug use: No    Comment: History of cocaine abuse   Sexual activity: Not Currently    Birth control/protection: None, Surgical

## 2023-04-21 ENCOUNTER — Telehealth: Payer: Self-pay | Admitting: Orthopaedic Surgery

## 2023-04-21 ENCOUNTER — Encounter (HOSPITAL_COMMUNITY): Payer: Self-pay | Admitting: Orthopaedic Surgery

## 2023-04-21 NOTE — Progress Notes (Signed)
SDW call  Patient was given pre-op instructions over the phone. Patient verbalized understanding of instructions provided.     PCP - denies Cardiologist -  Pulmonary:    PPM/ICD - denies Device Orders - n/a Rep Notified - n/a   Chest x-ray - 04/17/2023 EKG -  04/17/2023 Stress Test - ECHO -  Cardiac Cath -   Sleep Study/sleep apnea/CPAP: denies  Non-diabetic   Blood Thinner Instructions: denies Aspirin Instructions:denies   ERAS Protcol - Yes, clear fluids until 0730  COVID TEST- n/a    Anesthesia review: Yes. Seizures, asthma, + cocaine in urine 04/17/2023   Patient denies shortness of breath, fever, cough and chest pain over the phone call  Your procedure is scheduled on Monday 04/24/2023  Report to [facility] Main Entrance "A" at 0800   A.M., then check in with the Admitting office.  Call this number if you have problems the morning of surgery:  [phone]   If you have any questions prior to your surgery date call [phone]: Open Monday-Friday 8am-4pm If you experience any cold or flu symptoms such as cough, fever, chills, shortness of breath, etc. between now and your scheduled surgery, please notify us at the above number     Remember:  Do not eat after midnight the night before your surgery  You may drink clear liquids until  0730   the morning of your surgery.   Clear liquids allowed are: Water, Non-Citrus Juices (without pulp), Carbonated Beverages, Clear Tea, Black Coffee ONLY (NO MILK, CREAM OR POWDERED CREAMER of any kind), and Gatorade   Take these medicines as needed the morning of surgery with A SIP OF WATER:  Claritin, percocet, albuterol  As of today, STOP taking any Aspirin (unless otherwise instructed by your surgeon) Aleve, Naproxen, Ibuprofen, Motrin, Advil, Goody's, BC's, all herbal medications, fish oil, and all vitamins.
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
## 2023-04-21 NOTE — Anesthesia Preprocedure Evaluation (Signed)
Anesthesia Evaluation  Patient identified by MRN, date of birth, ID band Patient awake    Reviewed: Allergy & Precautions, NPO status , Patient's Chart, lab work & pertinent test results  History of Anesthesia Complications Negative for: history of anesthetic complications  Airway Mallampati: II  TM Distance: >3 FB Neck ROM: Full    Dental  (+) Dental Advisory Given   Pulmonary asthma , COPD,  COPD inhaler   breath sounds clear to auscultation       Cardiovascular negative cardio ROS  Rhythm:Regular Rate:Normal     Neuro/Psych     Bipolar Disorder   negative neurological ROS     GI/Hepatic negative GI ROS,,,(+)     substance abuse (last usage 7d ago)  cocaine use  Endo/Other  negative endocrine ROS    Renal/GU negative Renal ROS     Musculoskeletal   Abdominal   Peds  Hematology negative hematology ROS (+)   Anesthesia Other Findings   Reproductive/Obstetrics                             Anesthesia Physical Anesthesia Plan  ASA: 2  Anesthesia Plan: General   Post-op Pain Management: Tylenol PO (pre-op)*   Induction: Intravenous  PONV Risk Score and Plan: 3 and Ondansetron, Dexamethasone and Scopolamine patch - Pre-op  Airway Management Planned: Oral ETT  Additional Equipment: None  Intra-op Plan:   Post-operative Plan: Extubation in OR  Informed Consent: I have reviewed the patients History and Physical, chart, labs and discussed the procedure including the risks, benefits and alternatives for the proposed anesthesia with the patient or authorized representative who has indicated his/her understanding and acceptance.     Dental advisory given  Plan Discussed with: CRNA and Surgeon  Anesthesia Plan Comments: (PAT note written 04/21/2023 by Shonna Chock, PA-C.  )       Anesthesia Quick Evaluation

## 2023-04-21 NOTE — Telephone Encounter (Signed)
Pt called requesting a refill of oxycodone. Please send to pharmacy on file. Pt phone number is [phone].

## 2023-04-24 ENCOUNTER — Inpatient Hospital Stay (HOSPITAL_COMMUNITY)
Admission: RE | Admit: 2023-04-24 | Discharge: 2023-04-26 | DRG: 489 | Disposition: A | Discharge status: Home Health | Payer: Medicare PPO | Attending: Orthopaedic Surgery | Admitting: Orthopaedic Surgery

## 2023-04-24 ENCOUNTER — Other Ambulatory Visit: Payer: Self-pay

## 2023-04-24 ENCOUNTER — Inpatient Hospital Stay (HOSPITAL_COMMUNITY): Payer: Medicare PPO | Admitting: Vascular Surgery

## 2023-04-24 ENCOUNTER — Other Ambulatory Visit: Payer: Self-pay | Admitting: Physician Assistant

## 2023-04-24 ENCOUNTER — Encounter (HOSPITAL_COMMUNITY)
Admission: RE | Disposition: A | Discharge status: Home Health | Payer: Self-pay | Source: Home / Self Care | Attending: Orthopaedic Surgery

## 2023-04-24 ENCOUNTER — Inpatient Hospital Stay (HOSPITAL_COMMUNITY): Payer: Medicare PPO

## 2023-04-24 ENCOUNTER — Encounter (HOSPITAL_COMMUNITY): Payer: Self-pay | Admitting: Orthopaedic Surgery

## 2023-04-24 DIAGNOSIS — Z79899 Other long term (current) drug therapy: Secondary | ICD-10-CM

## 2023-04-24 DIAGNOSIS — Z90721 Acquired absence of ovaries, unilateral: Secondary | ICD-10-CM | POA: Diagnosis not present

## 2023-04-24 DIAGNOSIS — F1410 Cocaine abuse, uncomplicated: Secondary | ICD-10-CM | POA: Diagnosis present

## 2023-04-24 DIAGNOSIS — J45909 Unspecified asthma, uncomplicated: Secondary | ICD-10-CM | POA: Diagnosis present

## 2023-04-24 DIAGNOSIS — Z888 Allergy status to other drugs, medicaments and biological substances status: Secondary | ICD-10-CM | POA: Diagnosis not present

## 2023-04-24 DIAGNOSIS — M8580 Other specified disorders of bone density and structure, unspecified site: Secondary | ICD-10-CM | POA: Diagnosis present

## 2023-04-24 DIAGNOSIS — S83281A Other tear of lateral meniscus, current injury, right knee, initial encounter: Secondary | ICD-10-CM | POA: Diagnosis present

## 2023-04-24 DIAGNOSIS — Z885 Allergy status to narcotic agent status: Secondary | ICD-10-CM | POA: Diagnosis not present

## 2023-04-24 DIAGNOSIS — Z825 Family history of asthma and other chronic lower respiratory diseases: Secondary | ICD-10-CM

## 2023-04-24 DIAGNOSIS — Z833 Family history of diabetes mellitus: Secondary | ICD-10-CM | POA: Diagnosis not present

## 2023-04-24 DIAGNOSIS — S82141A Displaced bicondylar fracture of right tibia, initial encounter for closed fracture: Principal | ICD-10-CM | POA: Diagnosis present

## 2023-04-24 DIAGNOSIS — Z90710 Acquired absence of both cervix and uterus: Secondary | ICD-10-CM | POA: Diagnosis not present

## 2023-04-24 DIAGNOSIS — Z560 Unemployment, unspecified: Secondary | ICD-10-CM | POA: Diagnosis not present

## 2023-04-24 DIAGNOSIS — Z23 Encounter for immunization: Secondary | ICD-10-CM | POA: Diagnosis present

## 2023-04-24 DIAGNOSIS — Z8543 Personal history of malignant neoplasm of ovary: Secondary | ICD-10-CM

## 2023-04-24 DIAGNOSIS — Z8349 Family history of other endocrine, nutritional and metabolic diseases: Secondary | ICD-10-CM

## 2023-04-24 DIAGNOSIS — J3489 Other specified disorders of nose and nasal sinuses: Secondary | ICD-10-CM | POA: Diagnosis present

## 2023-04-24 DIAGNOSIS — Z8249 Family history of ischemic heart disease and other diseases of the circulatory system: Secondary | ICD-10-CM

## 2023-04-24 DIAGNOSIS — F319 Bipolar disorder, unspecified: Secondary | ICD-10-CM | POA: Diagnosis present

## 2023-04-24 DIAGNOSIS — Z9889 Other specified postprocedural states: Secondary | ICD-10-CM

## 2023-04-24 HISTORY — DX: Other complications of anesthesia, initial encounter: T88.59XA

## 2023-04-24 HISTORY — PX: ORIF TIBIA PLATEAU: SHX2132

## 2023-04-24 HISTORY — DX: Other specified postprocedural states: Z98.890

## 2023-04-24 SURGERY — OPEN REDUCTION INTERNAL FIXATION (ORIF) TIBIAL PLATEAU
Anesthesia: General | Laterality: Right

## 2023-04-24 MED ORDER — PHENYLEPHRINE 80 MCG/ML (10ML) SYRINGE FOR IV PUSH (FOR BLOOD PRESSURE SUPPORT)
PREFILLED_SYRINGE | INTRAVENOUS | Status: DC | PRN
  Administered 2023-04-24 (×2): 160 ug via INTRAVENOUS

## 2023-04-24 MED ORDER — MAGNESIUM CITRATE PO SOLN: 1.0000 | Freq: Once | ORAL | Status: DC | PRN

## 2023-04-24 MED ORDER — FENTANYL CITRATE (PF) 250 MCG/5ML IJ SOLN
INTRAMUSCULAR | Status: AC
  Filled 2023-04-24: qty 5

## 2023-04-24 MED ORDER — PHENYLEPHRINE 80 MCG/ML (10ML) SYRINGE FOR IV PUSH (FOR BLOOD PRESSURE SUPPORT)
PREFILLED_SYRINGE | INTRAVENOUS | Status: AC
  Filled 2023-04-24: qty 10

## 2023-04-24 MED ORDER — ACETAMINOPHEN 325 MG PO TABS: 325.0000 mg | ORAL_TABLET | Freq: Four times a day (QID) | ORAL | Status: DC | PRN

## 2023-04-24 MED ORDER — METHOCARBAMOL 500 MG PO TABS
500.0000 mg | ORAL_TABLET | Freq: Four times a day (QID) | ORAL | Status: DC | PRN
  Administered 2023-04-24 – 2023-04-26 (×3): 500 mg via ORAL
  Filled 2023-04-24 (×4): qty 1

## 2023-04-24 MED ORDER — METOCLOPRAMIDE HCL 5 MG/ML IJ SOLN: 5.0000 mg | Freq: Three times a day (TID) | INTRAMUSCULAR | Status: DC | PRN

## 2023-04-24 MED ORDER — METHOCARBAMOL 750 MG PO TABS: 750.0000 mg | ORAL_TABLET | Freq: Two times a day (BID) | ORAL | 2 refills | Status: DC | PRN

## 2023-04-24 MED ORDER — OXYCODONE HCL 5 MG PO TABS
5.0000 mg | ORAL_TABLET | ORAL | Status: DC | PRN
  Administered 2023-04-25: 10 mg via ORAL
  Filled 2023-04-24 (×2): qty 2

## 2023-04-24 MED ORDER — ONDANSETRON HCL 4 MG PO TABS: 4.0000 mg | ORAL_TABLET | Freq: Four times a day (QID) | ORAL | Status: DC | PRN

## 2023-04-24 MED ORDER — HYDROMORPHONE HCL 1 MG/ML IJ SOLN
0.2500 mg | INTRAMUSCULAR | Status: DC | PRN
  Administered 2023-04-24: 0.5 mg via INTRAVENOUS

## 2023-04-24 MED ORDER — HYDROMORPHONE HCL 1 MG/ML IJ SOLN
INTRAMUSCULAR | Status: AC
  Filled 2023-04-24: qty 0.5

## 2023-04-24 MED ORDER — OXYCODONE HCL 5 MG PO TABS: 5.0000 mg | ORAL_TABLET | Freq: Once | ORAL | Status: DC | PRN

## 2023-04-24 MED ORDER — ACETAMINOPHEN 500 MG PO TABS
1000.0000 mg | ORAL_TABLET | Freq: Four times a day (QID) | ORAL | Status: AC
  Administered 2023-04-24 – 2023-04-25 (×3): 1000 mg via ORAL
  Filled 2023-04-24 (×4): qty 2

## 2023-04-24 MED ORDER — PHENYLEPHRINE HCL-NACL 20-0.9 MG/250ML-% IV SOLN
INTRAVENOUS | Status: DC | PRN
  Administered 2023-04-24: 20 ug/min via INTRAVENOUS

## 2023-04-24 MED ORDER — PROPOFOL 10 MG/ML IV BOLUS
INTRAVENOUS | Status: DC | PRN
  Administered 2023-04-24: 150 mg via INTRAVENOUS

## 2023-04-24 MED ORDER — MIDAZOLAM HCL 2 MG/2ML IJ SOLN
0.5000 mg | Freq: Once | INTRAMUSCULAR | Status: AC | PRN
  Administered 2023-04-24: 1 mg via INTRAVENOUS

## 2023-04-24 MED ORDER — MIDAZOLAM HCL 2 MG/2ML IJ SOLN
INTRAMUSCULAR | Status: DC | PRN
  Administered 2023-04-24: 2 mg via INTRAVENOUS

## 2023-04-24 MED ORDER — HYDROMORPHONE HCL 1 MG/ML IJ SOLN
INTRAMUSCULAR | Status: AC
  Filled 2023-04-24: qty 1

## 2023-04-24 MED ORDER — PROPOFOL 10 MG/ML IV BOLUS
INTRAVENOUS | Status: AC
  Filled 2023-04-24: qty 20

## 2023-04-24 MED ORDER — ONDANSETRON HCL 4 MG/2ML IJ SOLN
INTRAMUSCULAR | Status: AC
  Filled 2023-04-24: qty 2

## 2023-04-24 MED ORDER — METHOCARBAMOL 1000 MG/10ML IJ SOLN: 500.0000 mg | Freq: Four times a day (QID) | INTRAVENOUS | Status: DC | PRN

## 2023-04-24 MED ORDER — ASPIRIN 81 MG PO CHEW
81.0000 mg | CHEWABLE_TABLET | Freq: Two times a day (BID) | ORAL | Status: DC
  Administered 2023-04-24 – 2023-04-26 (×5): 81 mg via ORAL
  Filled 2023-04-24 (×5): qty 1

## 2023-04-24 MED ORDER — MIDAZOLAM HCL 2 MG/2ML IJ SOLN
INTRAMUSCULAR | Status: AC
  Filled 2023-04-24: qty 2

## 2023-04-24 MED ORDER — DIPHENHYDRAMINE HCL 12.5 MG/5ML PO ELIX: 25.0000 mg | ORAL_SOLUTION | ORAL | Status: DC | PRN

## 2023-04-24 MED ORDER — DEXMEDETOMIDINE HCL IN NACL 80 MCG/20ML IV SOLN
INTRAVENOUS | Status: DC | PRN
Start: 2023-04-24 — End: 2023-04-24
  Administered 2023-04-24 (×2): 8 ug via INTRAVENOUS

## 2023-04-24 MED ORDER — TRANEXAMIC ACID-NACL 1000-0.7 MG/100ML-% IV SOLN
1000.0000 mg | Freq: Once | INTRAVENOUS | Status: AC
  Administered 2023-04-24: 1000 mg via INTRAVENOUS
  Filled 2023-04-24: qty 100

## 2023-04-24 MED ORDER — DEXAMETHASONE SODIUM PHOSPHATE 10 MG/ML IJ SOLN
INTRAMUSCULAR | Status: DC | PRN
  Administered 2023-04-24: 5 mg via INTRAVENOUS

## 2023-04-24 MED ORDER — HYDROMORPHONE HCL 1 MG/ML IJ SOLN
INTRAMUSCULAR | Status: DC | PRN
  Administered 2023-04-24 (×2): .5 mg via INTRAVENOUS

## 2023-04-24 MED ORDER — PROMETHAZINE HCL 25 MG/ML IJ SOLN: 6.2500 mg | INTRAMUSCULAR | Status: DC | PRN

## 2023-04-24 MED ORDER — ORAL CARE MOUTH RINSE: 15.0000 mL | Freq: Once | OROMUCOSAL | Status: AC

## 2023-04-24 MED ORDER — BUPIVACAINE HCL (PF) 0.25 % IJ SOLN
INTRAMUSCULAR | Status: AC
  Filled 2023-04-24: qty 30

## 2023-04-24 MED ORDER — ACETAMINOPHEN 500 MG PO TABS
1000.0000 mg | ORAL_TABLET | Freq: Once | ORAL | Status: AC
  Administered 2023-04-24: 1000 mg via ORAL
  Filled 2023-04-24: qty 2

## 2023-04-24 MED ORDER — ASPIRIN 81 MG PO TBEC: 81.0000 mg | DELAYED_RELEASE_TABLET | Freq: Two times a day (BID) | ORAL | 0 refills | Status: AC

## 2023-04-24 MED ORDER — VANCOMYCIN HCL 1000 MG IV SOLR
INTRAVENOUS | Status: AC
  Filled 2023-04-24: qty 20

## 2023-04-24 MED ORDER — ONDANSETRON HCL 4 MG PO TABS: 4.0000 mg | ORAL_TABLET | Freq: Three times a day (TID) | ORAL | 0 refills | Status: AC | PRN

## 2023-04-24 MED ORDER — LACTATED RINGERS IV SOLN: INTRAVENOUS | Status: DC

## 2023-04-24 MED ORDER — SORBITOL 70 % SOLN: 30.0000 mL | Freq: Every day | Status: DC | PRN

## 2023-04-24 MED ORDER — ROCURONIUM BROMIDE 10 MG/ML (PF) SYRINGE
PREFILLED_SYRINGE | INTRAVENOUS | Status: AC
  Filled 2023-04-24: qty 10

## 2023-04-24 MED ORDER — HYDROMORPHONE HCL 1 MG/ML IJ SOLN
0.2500 mg | INTRAMUSCULAR | Status: DC | PRN
  Administered 2023-04-24 (×4): 0.5 mg via INTRAVENOUS

## 2023-04-24 MED ORDER — INFLUENZA VAC A&B SURF ANT ADJ 0.5 ML IM SUSY
0.5000 mL | PREFILLED_SYRINGE | INTRAMUSCULAR | Status: AC
  Administered 2023-04-25: 0.5 mL via INTRAMUSCULAR
  Filled 2023-04-24: qty 0.5

## 2023-04-24 MED ORDER — OXYCODONE HCL 5 MG/5ML PO SOLN: 5.0000 mg | Freq: Once | ORAL | Status: DC | PRN

## 2023-04-24 MED ORDER — ONDANSETRON HCL 4 MG/2ML IJ SOLN
INTRAMUSCULAR | Status: DC | PRN
  Administered 2023-04-24: 4 mg via INTRAVENOUS

## 2023-04-24 MED ORDER — POLYETHYLENE GLYCOL 3350 17 G PO PACK: 17.0000 g | PACK | Freq: Every day | ORAL | Status: DC | PRN

## 2023-04-24 MED ORDER — DOCUSATE SODIUM 100 MG PO CAPS
100.0000 mg | ORAL_CAPSULE | Freq: Two times a day (BID) | ORAL | Status: DC
  Administered 2023-04-24 – 2023-04-26 (×5): 100 mg via ORAL
  Filled 2023-04-24 (×6): qty 1

## 2023-04-24 MED ORDER — CEFAZOLIN SODIUM-DEXTROSE 2-4 GM/100ML-% IV SOLN
2.0000 g | Freq: Four times a day (QID) | INTRAVENOUS | Status: AC
  Administered 2023-04-24 – 2023-04-25 (×3): 2 g via INTRAVENOUS
  Filled 2023-04-24 (×3): qty 100

## 2023-04-24 MED ORDER — CEFAZOLIN SODIUM-DEXTROSE 2-4 GM/100ML-% IV SOLN
2.0000 g | INTRAVENOUS | Status: AC
  Administered 2023-04-24: 2 g via INTRAVENOUS
  Filled 2023-04-24: qty 100

## 2023-04-24 MED ORDER — LIDOCAINE 2% (20 MG/ML) 5 ML SYRINGE
INTRAMUSCULAR | Status: AC
  Filled 2023-04-24: qty 5

## 2023-04-24 MED ORDER — MEPERIDINE HCL 25 MG/ML IJ SOLN: 6.2500 mg | INTRAMUSCULAR | Status: DC | PRN

## 2023-04-24 MED ORDER — LIDOCAINE 2% (20 MG/ML) 5 ML SYRINGE
INTRAMUSCULAR | Status: DC | PRN
  Administered 2023-04-24: 40 mg via INTRAVENOUS

## 2023-04-24 MED ORDER — HYDROMORPHONE HCL 1 MG/ML IJ SOLN
0.5000 mg | INTRAMUSCULAR | Status: DC | PRN
  Administered 2023-04-25 – 2023-04-26 (×4): 0.5 mg via INTRAVENOUS
  Filled 2023-04-24 (×4): qty 0.5

## 2023-04-24 MED ORDER — FENTANYL CITRATE (PF) 250 MCG/5ML IJ SOLN
INTRAMUSCULAR | Status: DC | PRN
  Administered 2023-04-24: 200 ug via INTRAVENOUS
  Administered 2023-04-24: 50 ug via INTRAVENOUS

## 2023-04-24 MED ORDER — ONDANSETRON HCL 4 MG/2ML IJ SOLN
4.0000 mg | Freq: Four times a day (QID) | INTRAMUSCULAR | Status: DC | PRN
  Administered 2023-04-24: 4 mg via INTRAVENOUS
  Filled 2023-04-24: qty 2

## 2023-04-24 MED ORDER — SODIUM CHLORIDE 0.9 % IV SOLN: INTRAVENOUS | Status: DC

## 2023-04-24 MED ORDER — 0.9 % SODIUM CHLORIDE (POUR BTL) OPTIME
TOPICAL | Status: DC | PRN
Start: 2023-04-24 — End: 2023-04-24
  Administered 2023-04-24: 2000 mL

## 2023-04-24 MED ORDER — METOCLOPRAMIDE HCL 5 MG PO TABS: 5.0000 mg | ORAL_TABLET | Freq: Three times a day (TID) | ORAL | Status: DC | PRN

## 2023-04-24 MED ORDER — SUGAMMADEX SODIUM 200 MG/2ML IV SOLN
INTRAVENOUS | Status: DC | PRN
  Administered 2023-04-24: 110 mg via INTRAVENOUS

## 2023-04-24 MED ORDER — ROCURONIUM BROMIDE 10 MG/ML (PF) SYRINGE
PREFILLED_SYRINGE | INTRAVENOUS | Status: DC | PRN
  Administered 2023-04-24: 60 mg via INTRAVENOUS

## 2023-04-24 MED ORDER — OXYCODONE HCL 5 MG PO TABS
10.0000 mg | ORAL_TABLET | ORAL | Status: DC | PRN
  Administered 2023-04-24: 10 mg via ORAL
  Administered 2023-04-24 – 2023-04-26 (×4): 15 mg via ORAL
  Filled 2023-04-24 (×4): qty 3

## 2023-04-24 MED ORDER — CHLORHEXIDINE GLUCONATE 0.12 % MT SOLN
15.0000 mL | Freq: Once | OROMUCOSAL | Status: AC
  Administered 2023-04-24: 15 mL via OROMUCOSAL
  Filled 2023-04-24: qty 15

## 2023-04-24 MED ORDER — EPHEDRINE 5 MG/ML INJ
INTRAVENOUS | Status: AC
  Filled 2023-04-24: qty 5

## 2023-04-24 MED ORDER — DEXAMETHASONE SODIUM PHOSPHATE 10 MG/ML IJ SOLN
INTRAMUSCULAR | Status: AC
  Filled 2023-04-24: qty 1

## 2023-04-24 SURGICAL SUPPLY — 70 items
BAG COUNTER SPONGE SURGICOUNT (BAG) ×2 IMPLANT
BAG SPNG CNTER NS LX DISP (BAG) ×1
BANDAGE ESMARK 6X9 LF (GAUZE/BANDAGES/DRESSINGS) ×2 IMPLANT
BIT DRILL 2.5MM SMALL QC EVOS (BIT) IMPLANT
BIT DRILL QC 2.5MM SHRT EVO SM (DRILL) IMPLANT
BLADE CLIPPER SURG (BLADE) IMPLANT
BNDG CMPR 5X6 CHSV STRCH STRL (GAUZE/BANDAGES/DRESSINGS)
BNDG CMPR 9X6 STRL LF SNTH (GAUZE/BANDAGES/DRESSINGS)
BNDG COHESIVE 6X5 TAN ST LF (GAUZE/BANDAGES/DRESSINGS) ×2 IMPLANT
BNDG ELASTIC 6X5.8 VLCR STR LF (GAUZE/BANDAGES/DRESSINGS) ×1 IMPLANT
BNDG ESMARK 6X9 LF (GAUZE/BANDAGES/DRESSINGS)
COVER SURGICAL LIGHT HANDLE (MISCELLANEOUS) ×2 IMPLANT
DRAPE C-ARM 42X72 X-RAY (DRAPES) ×1 IMPLANT
DRAPE C-ARMOR (DRAPES) ×1 IMPLANT
DRAPE HALF SHEET 40X57 (DRAPES) ×1 IMPLANT
DRAPE IMP U-DRAPE 54X76 (DRAPES) ×2 IMPLANT
DRAPE INCISE IOBAN 66X45 STRL (DRAPES) ×1 IMPLANT
DRAPE ORTHO SPLIT 77X108 STRL (DRAPES) ×2
DRAPE SURG ORHT 6 SPLT 77X108 (DRAPES) ×2 IMPLANT
DRAPE U-SHAPE 47X51 STRL (DRAPES) ×2 IMPLANT
DRILL 2.5MM SMALL QC EVOS (BIT) ×1
DRILL QC 2.5MM SHORT EVOS SM (DRILL) ×1
DURAPREP 26ML APPLICATOR (WOUND CARE) ×2 IMPLANT
ELECT REM PT RETURN 9FT ADLT (ELECTROSURGICAL) ×1
ELECTRODE REM PT RTRN 9FT ADLT (ELECTROSURGICAL) ×2 IMPLANT
FACESHIELD STD STERILE (MASK) ×2 IMPLANT
GAUZE PAD ABD 8X10 STRL (GAUZE/BANDAGES/DRESSINGS) ×2 IMPLANT
GAUZE SPONGE 4X4 12PLY STRL LF (GAUZE/BANDAGES/DRESSINGS) ×1 IMPLANT
GAUZE XEROFORM 1X8 LF (GAUZE/BANDAGES/DRESSINGS) ×2 IMPLANT
GLOVE BIOGEL PI IND STRL 7.0 (GLOVE) ×2 IMPLANT
GLOVE BIOGEL PI IND STRL 7.5 (GLOVE) ×2 IMPLANT
GLOVE ECLIPSE 7.0 STRL STRAW (GLOVE) ×1 IMPLANT
GLOVE SKINSENSE STRL SZ7.5 (GLOVE) ×2 IMPLANT
GLOVE SURG SYN 7.5 E (GLOVE) ×2 IMPLANT
GLOVE SURG SYN 7.5 PF PI (GLOVE) ×4 IMPLANT
GLOVE SURG UNDER POLY LF SZ7 (GLOVE) ×38 IMPLANT
GLOVE SURG UNDER POLY LF SZ7.5 (GLOVE) ×4 IMPLANT
GOWN STRL SURGICAL XL XLNG (GOWN DISPOSABLE) ×3 IMPLANT
K-WIRE 1.6 (WIRE) ×3
K-WIRE FX150X1.6XTROC PNT (WIRE) ×3
KIT BASIN OR (CUSTOM PROCEDURE TRAY) ×1 IMPLANT
KIT TURNOVER KIT B (KITS) ×2 IMPLANT
KWIRE FX150X1.6XTROC PNT (WIRE) IMPLANT
MANIFOLD NEPTUNE II (INSTRUMENTS) ×1 IMPLANT
NDL SUT 6 .5 CRC .975X.05 MAYO (NEEDLE) IMPLANT
NEEDLE MAYO TAPER (NEEDLE)
NS IRRIG 1000ML POUR BTL (IV SOLUTION) ×1 IMPLANT
PACK GENERAL/GYN (CUSTOM PROCEDURE TRAY) ×2 IMPLANT
PAD ARMBOARD 7.5X6 YLW CONV (MISCELLANEOUS) ×4 IMPLANT
PUTTY DBX 2.5CC (Putty) ×1 IMPLANT
PUTTY DBX 2.5CC DEPUY (Putty) IMPLANT
SCREW CORT 3.5X32 ST EVOS (Screw) IMPLANT
SCREW CORT ST EVOS 3.5X65 (Screw) IMPLANT
SCREW CTX 3.5X34MM EVOS (Screw) IMPLANT
SCREW LOCK 3.5X60MM EVOS (Screw) IMPLANT
SCREW LOCK 3.5X65MM (Screw) IMPLANT
SCREW LOCK 3.5X70MM EVOS (Plate) IMPLANT
SCREW LOCK EVOS ST 3.5X34 (Screw) IMPLANT
SCREW LOCK ST EVOS 3.5 X 55 (Screw) IMPLANT
STAPLER VISISTAT 35W (STAPLE) IMPLANT
SUT ETHILON 2 0 FS 18 (SUTURE) IMPLANT
SUT ETHILON 3 0 PS 1 (SUTURE) IMPLANT
SUT PDS AB 0 CT 36 (SUTURE) IMPLANT
SUT PDS AB 3-0 SH 27 (SUTURE) IMPLANT
SUT VIC AB 0 CT1 27 (SUTURE) ×2
SUT VIC AB 0 CT1 27XBRD ANBCTR (SUTURE) IMPLANT
SUT VIC AB 2-0 CT1 27 (SUTURE) ×2
SUT VIC AB 2-0 CT1 TAPERPNT 27 (SUTURE) ×1 IMPLANT
TOWEL GREEN STERILE (TOWEL DISPOSABLE) ×2 IMPLANT
TOWEL GREEN STERILE FF (TOWEL DISPOSABLE) ×1 IMPLANT

## 2023-04-24 NOTE — Anesthesia Postprocedure Evaluation (Signed)
Anesthesia Post Note  Patient: Mary Mccormick  Procedure(s) Performed: OPEN REDUCTION INTERNAL FIXATION (ORIF) RIGHT TIBIAL PLATEAU FRACTURE (Right)     Patient location during evaluation: PACU Anesthesia Type: General Level of consciousness: sedated, patient cooperative and oriented Pain management: pain level controlled Vital Signs Assessment: post-procedure vital signs reviewed and stable Respiratory status: spontaneous breathing, nonlabored ventilation, respiratory function stable and patient connected to nasal cannula oxygen Cardiovascular status: blood pressure returned to baseline and stable Postop Assessment: no apparent nausea or vomiting Anesthetic complications: no   No notable events documented.  Last Vitals:  Vitals:   04/24/23 1216 04/24/23 1226  BP: (!) 149/70 130/70  Pulse: 76 74  Resp:  16  Temp: 36.4 C   SpO2: 98% 99%    Last Pain:  Vitals:   04/24/23 1216  TempSrc: Oral  PainSc:                  Osmin Welz,E. Lewayne Pauley

## 2023-04-24 NOTE — Transfer of Care (Signed)
Immediate Anesthesia Transfer of Care Note  Patient: CHELY GAMBELL  Procedure(s) Performed: OPEN REDUCTION INTERNAL FIXATION (ORIF) RIGHT TIBIAL PLATEAU FRACTURE (Right)  Patient Location: PACU  Anesthesia Type:General  Level of Consciousness: awake  Airway & Oxygen Therapy: Patient connected to nasal cannula oxygen  Post-op Assessment: Report given to RN and Post -op Vital signs reviewed and stable  Post vital signs: Reviewed and stable  Last Vitals:  Vitals Value Taken Time  BP 175/94 04/24/23 1038  Temp    Pulse 80 04/24/23 1041  Resp 21 04/24/23 1041  SpO2 99 % 04/24/23 1041  Vitals shown include unfiled device data.  Last Pain:  Vitals:   04/24/23 0709  TempSrc:   PainSc: 10-Worst pain ever      Patients Stated Pain Goal: 0 (04/24/23 0709)  Complications: No notable events documented.
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
## 2023-04-24 NOTE — Progress Notes (Signed)
Pt arrived to room 5N11 via bed after surgery. Received report from Dahlia Client, RN in PACU. See assessment.

## 2023-04-24 NOTE — Discharge Instructions (Addendum)
Non-weight bearing to the operative extremity Do not remove bandage Wear knee brace at all times Take aspirin 81 mg twice daily x 6 weeks to prevent blood clots

## 2023-04-24 NOTE — Progress Notes (Signed)
Orthopedic Tech Progress Note Patient Details:  Mary Mccormick [birth year] [id number]  Patient ID: Mary Mccormick, female   DOB: 1956-01-05, 67 y.o.   MRN: [id number] Delivered bledsoe brace to bedside Tonye Pearson 04/24/2023, 11:37 AM

## 2023-04-24 NOTE — Telephone Encounter (Signed)
Can you please send in for me?!

## 2023-04-24 NOTE — Op Note (Signed)
Date of Surgery: 04/24/2023  INDICATIONS: Ms. Mary Mccormick is a 67 y.o.-year-old female with a right bicondylar tibial plateau.  The patient did consent to the procedure after discussion of the risks and benefits.  PREOPERATIVE DIAGNOSIS:  Right bicondylar tibial plateau fracture  POSTOPERATIVE DIAGNOSIS:  Right bicondylar tibial plateau fracture Lateral meniscus tear Osteopenic bone  PROCEDURE:  Open reduction internal fixation of right bicondylar tibial plateau fracture Repair of lateral meniscus tear Stress exam of the right knee under anesthesia  SURGEON: Mary Mccormick, M.D.  ASSIST: Mary Mccormick, [place]; necessary for the timely completion of procedure and due to complexity of procedure.  ANESTHESIA:  general  IV FLUIDS AND URINE: See anesthesia.  ESTIMATED BLOOD LOSS: 50 mL.  IMPLANTS:  Implant Name Type Inv. Item Serial No. Manufacturer Lot No. LRB No. Used Action  PUTTY DBX 2.5CC - [id number] Putty PUTTY DBX 2.5CC [id number] MUSCULOSKELETL TRANSPLANT FNDN  Right 1 Implanted  SCREW LOCK 3.5X70MM EVOS - [id number] Plate SCREW LOCK 3.5X70MM EVOS  [employer]  Right 1 Implanted  SCREW CORT ST EVOS 3.5X65 - [id number] Screw SCREW CORT ST EVOS 3.5X65  [employer]  Right 1 Implanted  SCREW CTX 3.5X34MM EVOS - [id number] Screw SCREW CTX 3.5X34MM EVOS  [employer]  Right 1 Implanted and Explanted  SCREW LOCK 3.5X60MM EVOS - [id number] Screw SCREW LOCK 3.5X60MM EVOS  [employer]  Right 3 Implanted  SCREW LOCK 3.5X65MM - [id number] Screw SCREW LOCK 3.5X65MM  [employer]  Right 2 Implanted  SCREW CORT 3.5X32 ST EVOS - [id number] Screw SCREW CORT 3.5X32 ST EVOS  [employer]  Right 1 Implanted  SCREW LOCK EVOS ST 3.5X34 - [id number] Screw SCREW LOCK EVOS ST 3.5X34  [employer]  Right 1 Implanted  SCREW LOCK ST EVOS 3.5 X 55 - [id number] Screw SCREW  LOCK ST EVOS 3.5 X 55  [employer]  Right 1 Implanted  SCREW LOCK ST EVOS 3.5 X 55 - [id number] Screw SCREW LOCK ST EVOS 3.5 X 55  [employer]  Right 1 Implanted    DRAINS: None  COMPLICATIONS: see description of procedure.  DESCRIPTION OF PROCEDURE: The patient was brought to the operating room.  The patient had been signed prior to the procedure and this was documented. The patient had the anesthesia placed by the anesthesiologist.  A time-out was performed to confirm that this was the correct patient, site, side and location. The patient did receive antibiotics prior to the incision and was re-dosed during the procedure as needed at indicated intervals.  A tourniquet was placed.  The patient had the operative extremity prepped and draped in the standard surgical fashion.   Stress exam of the knee under anesthesia was normal. Bony landmarks were palpated on the skin and marked and confirmed under fluoroscopy.  An incision was created over the lateral aspect of the knee extending from the lateral femoral condyle to anterior tibial crest.  Dissection was carried down to the IT band which was sharply incised in line with the incision.  IT band was sharply elevated off of Mary's Mccormick.  The anterior muscular compartment was bluntly elevated off of the proximal tibia with a Cobb elevator.  A submeniscal arthrotomy was performed.  Fracture hematoma was evacuated.  There was a small inferior tear of the body of the lateral meniscus which was repaired with a simple stitch using a 3-0 PDS.  We then turned our attention to fixation of the tibial plateau fracture.  Using fluoroscopic guidance 1/4 inch osteotome was used to create a small cortical window through which a bone tamp was inserted to elevate the depressed lateral articular surface.  This was directly visualized to be reduced back to the rest of the articular surface.  This was also confirmed under fluoroscopy.  This  was then backfilled with demineralized bone matrix putty and the articular fragment was provisionally pinned with a K wire.  The cortical window was then closed.  A locking plate was then placed on the lateral aspect of the tibial plateau at the appropriate position.  K wires were placed to further secure the plate to the tibia.  A nonlocking screw was placed proximally just under the depressed lateral fragment parallel to the joint line as a rafting screw.  There is also secured the plate down onto the bone.  Another nonlocking screw was placed in the distal hole of the plate to secure the distal portion of the plate to the tibial shaft.  I then placed locking screws through the proximal holes of the plate parallel to the joint line for fixation of the lateral fragment as well as the nondisplaced medial fracture.  Each screw had excellent fixation.  I placed an additional shaft screw to secure the plate as well as a kickstand screw.  Final x-rays were taken.  Surgical site was thoroughly irrigated and usual layered closure was performed using 0 Vicryl for the IT band, 2-0 Vicryl for the subcutaneous layer and 3-0 nylon for the skin.  Sterile dressings were applied.  Patient tolerated procedure well had no immediate complications.  Mary Mccormick was necessary for opening, closing, retracting, limb positioning and overall facilitation and timely completion of the procedure.  POSTOPERATIVE PLAN: Patient will be admitted for pain control and mobilization for physical therapy.  Mary Reel, MD 10:09 AM

## 2023-04-24 NOTE — Progress Notes (Signed)
    Durable Medical Equipment  (From admission, onward)           Start     Ordered   04/24/23 1304  DME Walker rolling  Once       Question:  Patient needs a walker to treat with the following condition  Answer:  History of open reduction and internal fixation (ORIF) procedure   04/24/23 1304   04/24/23 1304  DME 3 n 1  Once        04/24/23 1304   04/24/23 1304  DME Bedside commode  Once       Question:  Patient needs a bedside commode to treat with the following condition  Answer:  History of open reduction and internal fixation (ORIF) procedure   04/24/23 1304

## 2023-04-24 NOTE — Anesthesia Procedure Notes (Signed)
Procedure Name: Intubation Date/Time: 04/24/2023 8:45 AM  Performed by: Alwyn Ren, CRNAPre-anesthesia Checklist: Patient identified, Emergency Drugs available, Suction available and Patient being monitored Patient Re-evaluated:Patient Re-evaluated prior to induction Oxygen Delivery Method: Circle system utilized Preoxygenation: Pre-oxygenation with 100% oxygen Induction Type: IV induction Ventilation: Mask ventilation without difficulty Laryngoscope Size: Miller and 2 Grade View: Grade II Tube type: Oral Tube size: 7.0 mm Number of attempts: 1 Airway Equipment and Method: Stylet and Oral airway Placement Confirmation: ETT inserted through vocal cords under direct vision, positive ETCO2 and breath sounds checked- equal and bilateral Secured at: 22 cm Tube secured with: Tape Dental Injury: Teeth and Oropharynx as per pre-operative assessment  Comments: Very small mouth opening

## 2023-04-24 NOTE — Plan of Care (Signed)
  Problem: Pain Managment: Goal: General experience of comfort will improve Outcome: Progressing   Problem: Safety: Goal: Ability to remain free from injury will improve Outcome: Progressing   Problem: Skin Integrity: Goal: Risk for impaired skin integrity will decrease Outcome: Progressing   

## 2023-04-24 NOTE — H&P (Signed)
PREOPERATIVE H&P  Chief Complaint: right bicondylar tibial plateau fracture  HPI: Mary Mccormick is a 67 y.o. female who presents for surgical treatment of right bicondylar tibial plateau fracture.  She denies any changes in medical history.  Past Surgical History:  Procedure Laterality Date   APPENDECTOMY     OOPHORECTOMY     Not clear which side--per patient removed subsequent to TAH for ovarian cancer   TOTAL ABDOMINAL HYSTERECTOMY     With unilateral oophorectomy   Social History   Socioeconomic History   Marital status: Divorced    Spouse name: Not on file   Number of children: 2   Years of education: college   Highest education level: Not on file  Occupational History   Occupation: unemployed  Tobacco Use   Smoking status: Never   Smokeless tobacco: Never  Vaping Use   Vaping status: Never Used  Substance and Sexual Activity   Alcohol use: Yes    Alcohol/week: 1.0 standard drink of alcohol    Types: 1 Shots of liquor per week    Comment: occasional   Drug use: No    Comment: History of cocaine abuse   Sexual activity: Not Currently    Birth control/protection: None, Surgical  Other Topics Concern   Not on file  Social History Narrative   Lives with current husband, [name].   He is employed   Investment banker, operational: Not on file  Food Insecurity: Not on file  Transportation Needs: Not on file  Physical Activity: Not on file  Stress: Not on file  Social Connections: Not on file   Family History  Problem Relation Age of Onset   Hypertension Mother    Thyroid disease Mother    Diabetes Father    Hypertension Father    Heart disease Father    Cancer Maternal Grandfather        colon   Cancer Maternal Aunt        thyroid   Cancer Maternal Uncle        thyroid   Hypertension Paternal Uncle    Asthma Brother    Allergies  Allergen Reactions   Codeine Nausea And Vomiting   Tagamet [Cimetidine]  Itching and Rash   Prior to Admission medications   Medication Sig Start Date End Date Taking? Authorizing Provider  Cholecalciferol (VITAMIN D-3 PO) Take 1 tablet by mouth daily.   Yes [provider]  Cyanocobalamin (VITAMIN B-12 PO) Take 1 tablet by mouth daily.   Yes [provider]  ibuprofen (ADVIL,MOTRIN) 200 MG tablet Take 600 mg by mouth every 6 (six) hours as needed for moderate pain.   Yes [provider]  loratadine (CLARITIN) 10 MG tablet Take 10 mg by mouth daily.   Yes [provider]  Multiple Vitamin (MULTIVITAMIN WITH MINERALS) TABS tablet Take 1 tablet by mouth daily.   Yes [provider]  TURMERIC PO Take 1 tablet by mouth daily.   Yes [provider]  albuterol (PROVENTIL HFA;VENTOLIN HFA) 108 (90 Base) MCG/ACT inhaler Inhale 1-2 puffs into the lungs every 6 (six) hours as needed for wheezing or shortness of breath. Patient not taking: Reported on 04/21/2023 01/31/18   Wieters, Fran Lowes C, PA-C  cetirizine (ZYRTEC ALLERGY) 10 MG tablet Take 1 tablet (10 mg total) by mouth daily. Patient not taking: Reported on 10/06/2019 09/19/19 10/07/19  Darr, Gerilyn Pilgrim, PA-C  fluticasone [facility]) 50 MCG/ACT nasal spray Place 1 spray  into both nostrils daily. Patient not taking: Reported on 10/06/2019 09/19/19 10/07/19  Darr, Gerilyn Pilgrim, PA-C     Positive ROS: All other systems have been reviewed and were otherwise negative with the exception of those mentioned in the HPI and as above.  Physical Exam: General: Alert, no acute distress Cardiovascular: No pedal edema Respiratory: No cyanosis, no use of accessory musculature GI: abdomen soft Skin: No lesions in the area of chief complaint Neurologic: Sensation intact distally Psychiatric: Patient is competent for consent with normal mood and affect Lymphatic: no lymphedema  MUSCULOSKELETAL: exam stable  Assessment: right bicondylar tibial plateau fracture  Plan: Plan for Procedure(s): OPEN  REDUCTION INTERNAL FIXATION (ORIF) RIGHT TIBIAL PLATEAU FRACTURE  The risks benefits and alternatives were discussed with the patient including but not limited to the risks of nonoperative treatment, versus surgical intervention including infection, bleeding, nerve injury,  blood clots, cardiopulmonary complications, morbidity, mortality, among others, and they were willing to proceed.   Glee Arvin, MD 04/24/2023 7:15 AM

## 2023-04-25 MED ORDER — OXYCODONE HCL 5 MG PO TABS: 5.0000 mg | ORAL_TABLET | Freq: Two times a day (BID) | ORAL | 0 refills | Status: DC | PRN

## 2023-04-25 MED ORDER — OXYCODONE HCL 5 MG PO TABS: 5.0000 mg | ORAL_TABLET | Freq: Four times a day (QID) | ORAL | 0 refills | Status: DC | PRN
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
## 2023-04-25 NOTE — Telephone Encounter (Signed)
done
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
## 2023-04-25 NOTE — Plan of Care (Signed)
Problem: Clinical Measurements: Goal: Respiratory complications will improve Outcome: Progressing Goal: Cardiovascular complication will be avoided Outcome: Progressing   Problem: Coping: Goal: Level of anxiety will decrease Outcome: Progressing   Problem: Safety: Goal: Ability to remain free from injury will improve Outcome: Progressing

## 2023-04-25 NOTE — Addendum Note (Signed)
Addended by: Mayra Reel on: 04/25/2023 12:21 PM   Modules accepted: Orders

## 2023-04-25 NOTE — Progress Notes (Addendum)
Subjective: 1 Day Post-Op Procedure(s) (LRB): OPEN REDUCTION INTERNAL FIXATION (ORIF) RIGHT TIBIAL PLATEAU FRACTURE (Right) Patient reports pain as moderate.    Objective: Vital signs in last 24 hours: Temp:  [97.6 F (36.4 C)-98.6 F (37 C)] 98.6 F (37 C) (09/24 0731) Pulse Rate:  [71-84] 80 (09/24 0731) Resp:  [11-21] 17 (09/24 0731) BP: (130-184)/(62-94) 139/73 (09/24 0731) SpO2:  [97 %-100 %] 97 % (09/24 0731)  Intake/Output from previous day: 09/23 0701 - 09/24 0700 In: 1568 [P.O.:240; I.V.:1228; IV Piggyback:100] Out: 50 [Blood:50] Intake/Output this shift: No intake/output data recorded.  No results for input(s): "HGB" in the last 72 hours. No results for input(s): "WBC", "RBC", "HCT", "PLT" in the last 72 hours. No results for input(s): "NA", "K", "CL", "CO2", "BUN", "CREATININE", "GLUCOSE", "CALCIUM" in the last 72 hours. No results for input(s): "LABPT", "INR" in the last 72 hours.  Neurologically intact Neurovascular intact Sensation intact distally Intact pulses distally Incision: dressing C/D/I No cellulitis present Compartment soft Able to dorsiflex big toe.  I cannot get her to dorsiflex ankle which is likely from pain, not drop foot.   Assessment/Plan: 1 Day Post-Op Procedure(s) (LRB): OPEN REDUCTION INTERNAL FIXATION (ORIF) RIGHT TIBIAL PLATEAU FRACTURE (Right) Advance diet Up with therapy D/C IV fluids Non-weight bearing RLE Bledsoe brace in place at all times Will keep one more night for pain control. Plan to d/c home tomorrow      Cristie Hem 04/25/2023, 7:33 AM

## 2023-04-26 ENCOUNTER — Encounter (HOSPITAL_COMMUNITY): Payer: Self-pay | Admitting: Orthopaedic Surgery

## 2023-04-26 MED ORDER — OXYCODONE HCL 5 MG PO TABS: 5.0000 mg | ORAL_TABLET | Freq: Two times a day (BID) | ORAL | 0 refills | Status: DC | PRN

## 2023-04-26 NOTE — Progress Notes (Signed)
Discharge instructions given. Patient verbalized understanding and all questions were answered.  ?

## 2023-04-26 NOTE — TOC Transition Note (Addendum)
Transition of Care [facility]) - CM/SW Discharge Note   Patient Details  Name: Mary Mccormick MRN: [id number] Date of Birth: 1956-03-30  [facility]) CM/SW Contact:  Epifanio Lesches, RN Phone Number: 04/26/2023, 10:38 AM   Clinical Narrative:    Patient will DC to: home Anticipated DC date: 04/26/2023 Family notified: yes Transport by: car   Per MD patient ready for DC today. RN, patient, and patient's family notified of DC.   Pt agreeable to home health PT services. Pt without provider preference. Referral made with [facility], accept by Amy. Equipment RW, will be delivered to bedside prior to d/c, BSC already delivered to bedside. Pt without PCP, to f/u with [facility], noted on AVS Post hospital f/u noted on AVS. Pt to pick up medication from local pharmacy. Husband to provide transportation to home. [employer] [facility]ravo), [phone]   RNCM will sign off for now as intervention is no longer needed. Please consult [place] again if new needs arise.      Barriers to Discharge: No Barriers Identified   Patient Goals and CMS Choice   Choice offered to / list presented to : Patient  Discharge Placement                         Discharge Plan and Services Additional resources added to the After Visit Summary for     Discharge Planning Services: CM Consult            DME Arranged: Bedside commode, Walker rolling DME Agency: [employer] Date DME Agency Contacted: 04/24/23 Time DME Agency Contacted: [phone] Representative spoke with at DME Agency: Ian Malkin HH Arranged: PT HH Agency: [facility] Date [facility] Agency Contacted: 04/26/23 Time HH Agency Contacted: 1038 Representative spoke with at [facility] Agency: Amy  Social Determinants of Health (SDOH) Interventions SDOH Screenings   Food Insecurity: No Food Insecurity (04/24/2023)  Housing: Low Risk  (04/24/2023)  Transportation Needs: No Transportation Needs (04/24/2023)  Utilities: Not At  Risk (04/24/2023)  Tobacco Use: Low Risk  (04/24/2023)     Readmission Risk Interventions     No data to display

## 2023-04-26 NOTE — Progress Notes (Signed)
    Durable Medical Equipment  (From admission, onward)           Start     Ordered   04/24/23 1304  DME Walker rolling  Once       Question:  Patient needs a walker to treat with the following condition  Answer:  History of open reduction and internal fixation (ORIF) procedure   04/24/23 1304   04/24/23 1304  DME 3 n 1  Once        04/24/23 1304   04/24/23 1304  DME Bedside commode  Once       Question:  Patient needs a bedside commode to treat with the following condition  Answer:  History of open reduction and internal fixation (ORIF) procedure   04/24/23 1304
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
## 2023-04-26 NOTE — Plan of Care (Signed)

## 2023-04-26 NOTE — Progress Notes (Signed)
Subjective: 2 Days Post-Op Procedure(s) (LRB): OPEN REDUCTION INTERNAL FIXATION (ORIF) RIGHT TIBIAL PLATEAU FRACTURE (Right) Patient reports pain as moderate.    Objective: Vital signs in last 24 hours: Temp:  [98.1 F (36.7 C)-98.6 F (37 C)] 98.4 F (36.9 C) (09/24 1948) Pulse Rate:  [78-93] 90 (09/25 0429) Resp:  [16-18] 16 (09/25 0429) BP: (139-149)/(64-89) 149/89 (09/25 0429) SpO2:  [97 %-100 %] 99 % (09/25 0429)  Intake/Output from previous day: 09/24 0701 - 09/25 0700 In: 360 [P.O.:360] Out: -  Intake/Output this shift: No intake/output data recorded.  No results for input(s): "HGB" in the last 72 hours. No results for input(s): "WBC", "RBC", "HCT", "PLT" in the last 72 hours. No results for input(s): "NA", "K", "CL", "CO2", "BUN", "CREATININE", "GLUCOSE", "CALCIUM" in the last 72 hours. No results for input(s): "LABPT", "INR" in the last 72 hours.  PE Dressing c/d/I Ehl/fhl intact Able to hold ankle in dorsiflexion when its placed there    Assessment/Plan: 2 Days Post-Op Procedure(s) (LRB): OPEN REDUCTION INTERNAL FIXATION (ORIF) RIGHT TIBIAL PLATEAU FRACTURE (Right) Advance diet Up with therapy D/C IV fluids D/C home today NWB RLE Bledsoe brace to be worn to RLE at all times     Cristie Hem 04/26/2023, 7:31 AM

## 2023-04-27 ENCOUNTER — Telehealth: Payer: Self-pay | Admitting: Orthopaedic Surgery

## 2023-04-27 ENCOUNTER — Encounter: Payer: Self-pay | Admitting: Orthopaedic Surgery

## 2023-04-27 NOTE — Telephone Encounter (Signed)
Charrisse (PT) from [facility] called FYI Pain levels are 10 out of 10 with movement. Low pain level with sitting.Knee is warm to touch and swollen, Brace shifted and Charrisse is going to send pictures to Dr Roda Shutters.  Pt has not had a bowel movement since last 2023/05/13. Pt states she had passed a few small poop balls. Pt was not prescribed stool softener nor have taken stool softener. Charrisse is also requesting a script for a wheelchair with leg lift for pt should not be bending her knee at this time. Please call Charrisse at [phone].

## 2023-04-28 ENCOUNTER — Other Ambulatory Visit: Payer: Self-pay | Admitting: Physician Assistant

## 2023-04-28 ENCOUNTER — Telehealth: Payer: Self-pay | Admitting: Orthopaedic Surgery

## 2023-04-28 MED ORDER — GLYCERIN (ADULT) 2 G RE SUPP: 1.0000 | RECTAL | 0 refills | Status: AC | PRN

## 2023-04-28 MED ORDER — FLEET ENEMA RE ENEM: 1.0000 | ENEMA | Freq: Every day | RECTAL | 3 refills | Status: DC | PRN

## 2023-04-28 MED ORDER — MAGNESIUM CITRATE PO SOLN: 1.0000 | Freq: Two times a day (BID) | ORAL | 3 refills | Status: DC | PRN

## 2023-04-28 NOTE — Telephone Encounter (Signed)
Yes she can do all of that.

## 2023-04-28 NOTE — Telephone Encounter (Signed)
Approve on wheelchair.  I'll send in suppository and laxatives.  She needs to wean off the pain killers.

## 2023-04-28 NOTE — Telephone Encounter (Signed)
Pt called requesting a call from Dr Roda Shutters. Pt states she had surgery Monday and still have lots of pain and need a call back. Please call pt at [phone].

## 2023-04-28 NOTE — Telephone Encounter (Signed)
I spoke to patient about pain.  She is taking oxycodone and robaxin.  I discussed adding ibuprofen as well.  She could not understand why she was in so much pain so I reminded her that her drug screen was positive for cocaine on multiple occasions with the last time being days prior to sx and that her tolerance was not that of someone who does not use drugs.  She got very upset and started talking over me and yelling and eventually hung up on me

## 2023-05-01 NOTE — Telephone Encounter (Signed)
Called Charisse and notified her.

## 2023-05-05 ENCOUNTER — Telehealth: Payer: Self-pay | Admitting: Orthopaedic Surgery

## 2023-05-05 NOTE — Telephone Encounter (Signed)
Patient's husband called. Says she has 12 oxycodone left. Would like to go ahead and ask for a refill. Cb# [phone]

## 2023-05-09 ENCOUNTER — Other Ambulatory Visit (INDEPENDENT_AMBULATORY_CARE_PROVIDER_SITE_OTHER): Payer: Medicare PPO

## 2023-05-09 ENCOUNTER — Ambulatory Visit (INDEPENDENT_AMBULATORY_CARE_PROVIDER_SITE_OTHER): Payer: Medicare PPO | Admitting: Orthopaedic Surgery

## 2023-05-09 DIAGNOSIS — S82141A Displaced bicondylar fracture of right tibia, initial encounter for closed fracture: Secondary | ICD-10-CM

## 2023-05-09 MED ORDER — TRAMADOL HCL 50 MG PO TABS
50.0000 mg | ORAL_TABLET | Freq: Every day | ORAL | 0 refills | Status: DC | PRN
Start: 2023-05-09 — End: 2023-06-06
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
## 2023-05-10 ENCOUNTER — Ambulatory Visit: Payer: Medicare PPO | Admitting: Physical Therapy

## 2023-05-16 ENCOUNTER — Ambulatory Visit: Payer: Medicare PPO | Admitting: Physical Therapy

## 2023-05-16 ENCOUNTER — Encounter: Payer: Self-pay | Admitting: Physical Therapy

## 2023-05-16 ENCOUNTER — Other Ambulatory Visit: Payer: Self-pay

## 2023-05-16 DIAGNOSIS — R262 Difficulty in walking, not elsewhere classified: Secondary | ICD-10-CM | POA: Diagnosis not present

## 2023-05-16 DIAGNOSIS — M6281 Muscle weakness (generalized): Secondary | ICD-10-CM

## 2023-05-16 DIAGNOSIS — M25561 Pain in right knee: Secondary | ICD-10-CM

## 2023-05-16 DIAGNOSIS — M25661 Stiffness of right knee, not elsewhere classified: Secondary | ICD-10-CM

## 2023-05-16 DIAGNOSIS — R600 Localized edema: Secondary | ICD-10-CM
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
## 2023-05-17 ENCOUNTER — Telehealth: Payer: Self-pay | Admitting: Orthopaedic Surgery

## 2023-05-17 NOTE — Telephone Encounter (Signed)
Received call from [facility] with [facility] she advised have not been able to contact patient since last week. Mary Mccormick said she is discharging patient today. The number to contact Mary Mccormick is  [phone]

## 2023-05-23 ENCOUNTER — Encounter: Payer: Medicare PPO | Admitting: Physical Therapy
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
## 2023-05-24 ENCOUNTER — Encounter: Payer: Self-pay | Admitting: Internal Medicine

## 2023-05-24 ENCOUNTER — Ambulatory Visit: Payer: Medicare PPO | Admitting: Internal Medicine

## 2023-05-24 VITALS — BP 145/79 | HR 84 | Temp 98.0°F | Wt 111.9 lb

## 2023-05-24 DIAGNOSIS — R569 Unspecified convulsions: Secondary | ICD-10-CM | POA: Diagnosis not present

## 2023-05-24 DIAGNOSIS — F319 Bipolar disorder, unspecified: Secondary | ICD-10-CM | POA: Diagnosis not present

## 2023-05-24 DIAGNOSIS — Z1231 Encounter for screening mammogram for malignant neoplasm of breast: Secondary | ICD-10-CM

## 2023-05-24 DIAGNOSIS — S82141A Displaced bicondylar fracture of right tibia, initial encounter for closed fracture: Secondary | ICD-10-CM

## 2023-05-24 DIAGNOSIS — J4520 Mild intermittent asthma, uncomplicated: Secondary | ICD-10-CM

## 2023-05-24 DIAGNOSIS — I10 Essential (primary) hypertension: Secondary | ICD-10-CM

## 2023-05-24 DIAGNOSIS — F1411 Cocaine abuse, in remission: Secondary | ICD-10-CM

## 2023-05-24 DIAGNOSIS — Z139 Encounter for screening, unspecified: Secondary | ICD-10-CM

## 2023-05-24 MED ORDER — AMLODIPINE BESYLATE 5 MG PO TABS
5.0000 mg | ORAL_TABLET | Freq: Every day | ORAL | 11 refills | Status: DC
Start: 2023-05-24 — End: 2024-02-06

## 2023-05-24 NOTE — Assessment & Plan Note (Signed)
Hasn't had seizure in since 2008. She was taking was on phenobarbital then switched to depakote. Unknown cause of seizures. She does not recall ever seeing a neurologist. Endorses occasional alcohol use, a few times weekly. Denies withdrawal symptoms. P: Continue to monitor

## 2023-05-24 NOTE — Assessment & Plan Note (Addendum)
Diagnosed in 2000s. Hasn't had a flare up in some time. Does not have inhaler at home.  Allergies seem to trigger symptoms.
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
## 2023-05-24 NOTE — Assessment & Plan Note (Signed)
  She is still taking methacarbomol when pain is severe but not daily.

## 2023-05-24 NOTE — Assessment & Plan Note (Signed)
She was taking depakote 15 years ago for seizure disorder and Bipolar disorder. She is not interested in a psychiatry referral at this time. She would feel open to talking with behavioral therapist.    05/24/2023    4:24 PM 02/23/2016   12:07 PM 02/23/2016   12:05 PM  Depression screen PHQ 2/9  Decreased Interest 0 3 3  Down, Depressed, Hopeless 3 3 3   PHQ - 2 Score 3 6 6   Altered sleeping 2 3 3   Tired, decreased energy 1 3 3   Change in appetite 2 3 3   Feeling bad or failure about yourself  0 0 0  Trouble concentrating 1 3 3   Moving slowly or fidgety/restless 0 2 2  Suicidal thoughts 0 0 0  PHQ-9 Score 9 20 20   Difficult doing work/chores Extremely dIfficult  Not difficult at all      05/24/2023    4:24 PM  GAD 7 : Generalized Anxiety Score  Nervous, Anxious, on Edge 3  Control/stop worrying 3  Worry too much - different things 3  Trouble relaxing 3  Restless 2  Easily annoyed or irritable 3  Afraid - awful might happen 3  Total GAD 7 Score 20   She feels that anxiety and depression screenings are elevated due to current situation. P: Referral to integrated behavioral health Ideally would refer to psychiatry at next office visit if patient is open to this.

## 2023-05-24 NOTE — Patient Instructions (Signed)
Thank you, Ms.Nehemiah G Barreira for allowing [place] to provide your care today.   Your blood pressure was high. I would like to start amlodipine 5 mg. Please take this daily and follow-up in 4 weeks.  I have referred to you Christen Butter, she is a therapist who I think will be a good resource for you.  I have placed referral for mammogram.  Referrals ordered today:    Referral Orders         Ambulatory referral to Integrated Behavioral Health      I have ordered the following medication/changed the following medications:   Stop the following medications: Medications Discontinued During This Encounter  Medication Reason   oxyCODONE (OXY IR/ROXICODONE) 5 MG immediate release tablet    magnesium citrate SOLN    sodium phosphate (FLEET) ENEM      Start the following medications: Meds ordered this encounter  Medications   amLODipine (NORVASC) 5 MG tablet    Sig: Take 1 tablet (5 mg total) by mouth daily.    Dispense:  30 tablet    Refill:  11     Follow up:  4 weeks    We look forward to seeing you next time. Please call our clinic at [phone] if you have any questions or concerns. The best time to call is Monday-Friday from 9am-4pm, but there is someone available 24/7. If after hours or the weekend, call the main hospital number and ask for the Internal Medicine Resident On-Call. If you need medication refills, please notify your pharmacy one week in advance and they will send [place] a request.   Thank you for trusting me with your care. Wishing you the best!   Rudene Christians, DO [facility]

## 2023-05-25 ENCOUNTER — Encounter: Payer: Self-pay | Admitting: Internal Medicine

## 2023-05-25 DIAGNOSIS — Z1239 Encounter for other screening for malignant neoplasm of breast: Secondary | ICD-10-CM | POA: Insufficient documentation

## 2023-05-25 DIAGNOSIS — I10 Essential (primary) hypertension: Secondary | ICD-10-CM | POA: Insufficient documentation

## 2023-05-25 NOTE — Assessment & Plan Note (Signed)
Mammogram order placed

## 2023-05-25 NOTE — Assessment & Plan Note (Signed)
Blood pressure consistently elevated >145/79. On chart review, BP appears elevated at prior ED visits as well. This could be 2/2 to cocaine use as well. P: Start amlodipine 5 mg Follow-up in 4 weeks
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
## 2023-05-26 NOTE — Addendum Note (Signed)
Addended by: Dickie La on: 05/26/2023 08:59 AM   Modules accepted: Level of Service

## 2023-05-26 NOTE — Progress Notes (Signed)
Internal Medicine Clinic Attending  Case discussed with the resident at the time of the visit.  We reviewed the resident's history and exam and pertinent patient test results.  I agree with the assessment, diagnosis, and plan of care documented in the resident's note.  

## 2023-05-29 ENCOUNTER — Encounter: Payer: Medicare PPO | Admitting: Physical Therapy

## 2023-05-30 ENCOUNTER — Encounter: Payer: Self-pay | Admitting: Physical Therapy

## 2023-05-30 ENCOUNTER — Ambulatory Visit: Payer: Medicare PPO | Admitting: Physical Therapy

## 2023-05-30 DIAGNOSIS — M25661 Stiffness of right knee, not elsewhere classified: Secondary | ICD-10-CM

## 2023-05-30 DIAGNOSIS — R600 Localized edema: Secondary | ICD-10-CM

## 2023-05-30 DIAGNOSIS — M6281 Muscle weakness (generalized): Secondary | ICD-10-CM | POA: Diagnosis not present

## 2023-05-30 DIAGNOSIS — R262 Difficulty in walking, not elsewhere classified: Secondary | ICD-10-CM

## 2023-05-30 DIAGNOSIS — M25561 Pain in right knee: Secondary | ICD-10-CM | POA: Diagnosis not present
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
## 2023-05-31 ENCOUNTER — Other Ambulatory Visit: Payer: Self-pay | Admitting: Orthopaedic Surgery

## 2023-06-01 ENCOUNTER — Telehealth: Payer: Self-pay | Admitting: *Deleted

## 2023-06-01 NOTE — Telephone Encounter (Signed)
Unable to contact patient /lvm regarding mammogram appointment - breast center 07-04-2023 @ 4:10 p arrive 4:00 pm / contact office if needing reschedule or cancel appointment [phone] fail to do so will be a 75.00 no show fee.

## 2023-06-04 ENCOUNTER — Other Ambulatory Visit: Payer: Self-pay | Admitting: Physician Assistant

## 2023-06-06 ENCOUNTER — Other Ambulatory Visit: Payer: Self-pay | Admitting: Physician Assistant

## 2023-06-06 ENCOUNTER — Ambulatory Visit: Payer: Medicare PPO | Admitting: Physical Therapy

## 2023-06-06 ENCOUNTER — Encounter: Payer: Self-pay | Admitting: Physical Therapy

## 2023-06-06 ENCOUNTER — Telehealth: Payer: Self-pay | Admitting: Orthopaedic Surgery

## 2023-06-06 DIAGNOSIS — R600 Localized edema: Secondary | ICD-10-CM

## 2023-06-06 DIAGNOSIS — R262 Difficulty in walking, not elsewhere classified: Secondary | ICD-10-CM | POA: Diagnosis not present

## 2023-06-06 DIAGNOSIS — M25661 Stiffness of right knee, not elsewhere classified: Secondary | ICD-10-CM

## 2023-06-06 DIAGNOSIS — M25561 Pain in right knee: Secondary | ICD-10-CM | POA: Diagnosis not present

## 2023-06-06 DIAGNOSIS — M6281 Muscle weakness (generalized): Secondary | ICD-10-CM

## 2023-06-06 MED ORDER — METHOCARBAMOL 750 MG PO TABS: 750.0000 mg | ORAL_TABLET | Freq: Two times a day (BID) | ORAL | 2 refills | Status: DC | PRN

## 2023-06-06 MED ORDER — TRAMADOL HCL 50 MG PO TABS: 50.0000 mg | ORAL_TABLET | Freq: Every day | ORAL | 0 refills | Status: DC | PRN

## 2023-06-06 NOTE — Telephone Encounter (Signed)
sent 

## 2023-06-06 NOTE — Telephone Encounter (Signed)
Notified patient via voicemail. 
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
## 2023-06-06 NOTE — Telephone Encounter (Signed)
Pt came into office requesting refills of methocarbamol and tramadol ASAP. Pt states she sent request a week ago. Please send to pharmacy on file and cell pt when sent in. Pt phone number is [phone].

## 2023-06-08 ENCOUNTER — Ambulatory Visit: Payer: Medicare PPO | Admitting: Physician Assistant

## 2023-06-08 ENCOUNTER — Ambulatory Visit (INDEPENDENT_AMBULATORY_CARE_PROVIDER_SITE_OTHER): Payer: Medicare PPO

## 2023-06-08 DIAGNOSIS — S82141A Displaced bicondylar fracture of right tibia, initial encounter for closed fracture: Secondary | ICD-10-CM | POA: Diagnosis not present

## 2023-06-08 MED ORDER — MUPIROCIN 2 % EX OINT: 1.0000 | TOPICAL_OINTMENT | Freq: Two times a day (BID) | CUTANEOUS | 0 refills | Status: AC

## 2023-06-08 NOTE — Progress Notes (Signed)
Post-Op Visit Note   Patient: Mary Mccormick           Date of Birth: Jan 03, 1956           MRN: [id number] Visit Date: 06/08/2023 PCP: Rudene Christians, DO   Assessment & Plan:  Chief Complaint: No chief complaint on file.  Visit Diagnoses:  1. Closed bicondylar fracture of right tibial plateau     Plan: Patient is a pleasant 67 year old female who comes in today 6 weeks status post ORIF right bicondylar tibial plateau fracture 04/24/2023.  She has been doing well.  She is taking tramadol and Robaxin for pain.  She has been compliant taking a baby aspirin twice daily for DVT prophylaxis.  She has been in physical therapy working on range of motion.  She does tell me she started weightbearing this past Sunday and has not had any issues.  Examination of her right knee reveals a fully healed surgical scar.  She does have a small Vicryl coming out of the proximal wound.  No signs of abscess.  No cellulitis.  Range of motion 0 to 120 degrees.  She is neurovascular intact distally.  Today, I removed the Vicryl suture.  We have applied mupirocin and a Band-Aid.  I sent in mupirocin to her pharmacy and she will apply this twice daily and cover with a Band-Aid until the wound is healed.  Continue with physical therapy.  She will follow-up in 6 weeks for repeat evaluation and 2 view x-rays of the right knee.  Follow-Up Instructions: Return in about 6 weeks (around 07/20/2023).   Orders:  Orders Placed This Encounter  Procedures   XR Knee 1-2 Views Right   Meds ordered this encounter  Medications   mupirocin ointment (BACTROBAN) 2 %    Sig: Apply 1 Application topically 2 (two) times daily. Apply twice daily and cover with band aid until area has healed    Dispense:  22 g    Refill:  0    Imaging: XR Knee 1-2 Views Right  Result Date: 06/08/2023 X-rays demonstrate consolidation of the fracture site.  No hardware complication.   PMFS History: Patient Active Problem List   Diagnosis Date  Noted   Hypertension 05/25/2023   Breast cancer screening 05/25/2023   History of open reduction and internal fixation (ORIF) procedure 04/24/2023   Closed bicondylar fracture of right tibial plateau 04/19/2023   History of cocaine abuse (HCC) 05/10/2016   Bipolar disorder (HCC) 05/10/2016   Asthma    Seasonal allergies    Seizure (HCC)    Past Medical History:  Diagnosis Date   Asthma    Bipolar disorder (HCC) 05/10/2016   Cocaine abuse (HCC)    Complication of anesthesia    Doesn't wake up very well   Nasal septal perforation    Seasonal allergies    Seizure (HCC)    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  Was on Phenobarbital at one time--perhaps moved her to depakote    Family History  Problem Relation Age of Onset   Hypertension Mother    Thyroid disease Mother    Diabetes Father    Hypertension Father    Heart disease Father    Cancer Maternal Grandfather        colon   Cancer Maternal Aunt        thyroid   Cancer Maternal Uncle        thyroid   Hypertension Paternal Uncle    Asthma Brother  Past Surgical History:  Procedure Laterality Date   APPENDECTOMY     OOPHORECTOMY     Not clear which side--per patient removed subsequent to TAH for ovarian cancer   ORIF TIBIA PLATEAU Right 04/24/2023   Procedure: OPEN REDUCTION INTERNAL FIXATION (ORIF) RIGHT TIBIAL PLATEAU FRACTURE;  Surgeon: Tarry Kos, MD;  Location: [facility] OR;  Service: Orthopedics;  Laterality: Right;   right tonsillectomy     TOTAL ABDOMINAL HYSTERECTOMY     With unilateral oophorectomy   Social History   Occupational History   Occupation: unemployed  Tobacco Use   Smoking status: Never   Smokeless tobacco: Never  Vaping Use   Vaping status: Never Used  Substance and Sexual Activity   Alcohol use: Yes    Alcohol/week: 1.0 standard drink of alcohol    Types: 1 Shots of liquor per week    Comment: occasional   Drug use: Yes    Types: "Crack" cocaine    Comment: History of cocaine abuse   Sexual  activity: Not Currently    Birth control/protection: None, Surgical

## 2023-06-12 ENCOUNTER — Telehealth: Payer: Self-pay | Admitting: Orthopaedic Surgery

## 2023-06-12 ENCOUNTER — Encounter: Payer: Medicare PPO | Admitting: Physical Therapy

## 2023-06-12 NOTE — Telephone Encounter (Signed)
Patient called. 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 She would like to pick the note up tomorrow while here for PT at 11:45am

## 2023-06-12 NOTE — Telephone Encounter (Signed)
yes

## 2023-06-12 NOTE — Telephone Encounter (Signed)
ORIF right bicondylar tibial plateau fracture 04/24/2023

## 2023-06-13 ENCOUNTER — Encounter: Payer: Self-pay | Admitting: Physical Therapy

## 2023-06-13 ENCOUNTER — Ambulatory Visit: Payer: Medicare PPO | Admitting: Physical Therapy

## 2023-06-13 DIAGNOSIS — M25661 Stiffness of right knee, not elsewhere classified: Secondary | ICD-10-CM | POA: Diagnosis not present

## 2023-06-13 DIAGNOSIS — R262 Difficulty in walking, not elsewhere classified: Secondary | ICD-10-CM | POA: Diagnosis not present

## 2023-06-13 DIAGNOSIS — M25561 Pain in right knee: Secondary | ICD-10-CM | POA: Diagnosis not present

## 2023-06-13 DIAGNOSIS — R600 Localized edema: Secondary | ICD-10-CM

## 2023-06-13 DIAGNOSIS — M6281 Muscle weakness (generalized): Secondary | ICD-10-CM | POA: Diagnosis not present

## 2023-06-13 NOTE — Telephone Encounter (Signed)
Note placed up front for pick-up.
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
## 2023-06-14 ENCOUNTER — Telehealth: Payer: Self-pay | Admitting: Orthopaedic Surgery

## 2023-06-14 ENCOUNTER — Other Ambulatory Visit: Payer: Self-pay | Admitting: Physician Assistant

## 2023-06-14 MED ORDER — TRAMADOL HCL 50 MG PO TABS: 50.0000 mg | ORAL_TABLET | Freq: Every day | ORAL | 0 refills | Status: DC | PRN

## 2023-06-14 MED ORDER — METHOCARBAMOL 750 MG PO TABS: 750.0000 mg | ORAL_TABLET | Freq: Two times a day (BID) | ORAL | 2 refills | Status: AC | PRN

## 2023-06-14 NOTE — Telephone Encounter (Signed)
Notified patient via voicemail. 

## 2023-06-14 NOTE — Telephone Encounter (Signed)
Pt requesting refill on Tramadol and Methocarbamol [facility] [address] please advise

## 2023-06-14 NOTE — Telephone Encounter (Signed)
sent 

## 2023-06-21 ENCOUNTER — Encounter: Payer: Medicare PPO | Admitting: Student

## 2023-06-23 ENCOUNTER — Encounter: Payer: Medicare PPO | Admitting: Rehabilitative and Restorative Service Providers"

## 2023-06-27 ENCOUNTER — Encounter: Payer: Medicare PPO | Admitting: Physical Therapy

## 2023-07-04 ENCOUNTER — Ambulatory Visit: Payer: Medicare PPO

## 2023-07-04 ENCOUNTER — Telehealth: Payer: Self-pay | Admitting: Physical Therapy

## 2023-07-04 ENCOUNTER — Encounter: Payer: Medicare PPO | Admitting: Physical Therapy

## 2023-07-04 NOTE — Telephone Encounter (Signed)
I called pt to follow up after she missed her 9:30 PT appointment this morning. I left a message for pt call [phone]) to reschedule if needed and reminded pt of her next appointment which is Tuesday 07/11/23 at 10:15.  Narda Amber, PT, MPT 07/04/23 9:52 AM

## 2023-07-11 ENCOUNTER — Encounter: Payer: Medicare PPO | Admitting: Physical Therapy

## 2023-07-18 ENCOUNTER — Telehealth: Payer: Self-pay | Admitting: Physical Therapy

## 2023-07-18 ENCOUNTER — Encounter: Payer: Medicare PPO | Admitting: Physical Therapy

## 2023-07-18 ENCOUNTER — Ambulatory Visit
Admission: RE | Admit: 2023-07-18 | Discharge: 2023-07-18 | Disposition: A | Discharge status: Home / Self Care | Payer: Medicare PPO | Source: Ambulatory Visit | Attending: Internal Medicine | Admitting: Internal Medicine

## 2023-07-18 DIAGNOSIS — Z139 Encounter for screening, unspecified: Secondary | ICD-10-CM

## 2023-07-18 HISTORY — DX: Malignant neoplasm of unspecified ovary: C56.9

## 2023-07-18 NOTE — Telephone Encounter (Signed)
I left a message with pt after she didn't show for her therapy appointment today. I reminded her of her upcoming appointment on 08/08/23 at 10:15.  Our clinic number [phone] for pt to reschedule if needed.  Narda Amber, PT, MPT 07/18/23 10:38 AM

## 2023-07-20 ENCOUNTER — Telehealth: Payer: Self-pay | Admitting: Orthopaedic Surgery

## 2023-07-20 ENCOUNTER — Encounter: Payer: Medicare PPO | Admitting: Physician Assistant

## 2023-07-20 MED ORDER — TRAMADOL HCL 50 MG PO TABS: 50.0000 mg | ORAL_TABLET | Freq: Every day | ORAL | 0 refills | Status: AC | PRN

## 2023-07-20 NOTE — Telephone Encounter (Signed)
done

## 2023-07-20 NOTE — Telephone Encounter (Signed)
Pt requesting [facility]

## 2023-07-20 NOTE — Addendum Note (Signed)
Addended by: Mayra Reel on: 07/20/2023 05:44 PM   Modules accepted: Orders

## 2023-07-31 ENCOUNTER — Ambulatory Visit (INDEPENDENT_AMBULATORY_CARE_PROVIDER_SITE_OTHER): Payer: Medicare PPO | Admitting: Physician Assistant

## 2023-07-31 ENCOUNTER — Other Ambulatory Visit (INDEPENDENT_AMBULATORY_CARE_PROVIDER_SITE_OTHER): Payer: Medicare PPO

## 2023-07-31 ENCOUNTER — Encounter: Payer: Self-pay | Admitting: Physician Assistant

## 2023-07-31 DIAGNOSIS — S82141A Displaced bicondylar fracture of right tibia, initial encounter for closed fracture: Secondary | ICD-10-CM | POA: Diagnosis not present

## 2023-07-31 MED ORDER — TRAMADOL HCL 50 MG PO TABS: 50.0000 mg | ORAL_TABLET | Freq: Two times a day (BID) | ORAL | 1 refills | Status: AC | PRN

## 2023-07-31 NOTE — Progress Notes (Signed)
Post-Op Visit Note   Patient: Mary Mccormick           Date of Birth: 12-25-1955           MRN: [id number] Visit Date: 07/31/2023 PCP: Rudene Christians, DO   Assessment & Plan:  Chief Complaint:  Chief Complaint  Patient presents with   Right Knee - Follow-up    ORIF bicondylar tibial plateau fracture 04/24/2023   Visit Diagnoses:  1. Closed bicondylar fracture of right tibial plateau     Plan: Patient is a 67 year old female who comes in today a little over 3 months status post ORIF right bicondylar tibial plateau fracture 04/24/2023.  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  She thinks she may be overdoing it.  She has not been able to attend as many physical therapy sessions as she wishes as she has been unable to find a ride.  She tells me she continues to work on a home exercise program.  Examination of her right knee reveals no effusion.  Range of motion 0 to 110 degrees.  No joint line tenderness.  She is neurovascularly intact distally.  At this point, she will continue with her home exercise program.  We have discussed backing off activity as needed.  I refilled her tramadol.  She will follow-up in 6 weeks for repeat evaluation and 2 view x-rays of the right knee.  Call with concerns or questions.  Follow-Up Instructions: Return in about 6 weeks (around 09/11/2023).   Orders:  Orders Placed This Encounter  Procedures   XR Knee 1-2 Views Right   No orders of the defined types were placed in this encounter.   Imaging: XR Knee 1-2 Views Right Result Date: 07/31/2023 X-rays demonstrate consolidation of the fracture site   PMFS History: Patient Active Problem List   Diagnosis Date Noted   Hypertension 05/25/2023   Breast cancer screening 05/25/2023   History of open reduction and internal fixation (ORIF) procedure 04/24/2023   Closed bicondylar fracture of right tibial plateau 04/19/2023    History of cocaine abuse (HCC) 05/10/2016   Bipolar disorder (HCC) 05/10/2016   Asthma    Seasonal allergies    Seizure (HCC)    Past Medical History:  Diagnosis Date   Asthma    Bipolar disorder (HCC) 05/10/2016   Cocaine abuse (HCC)    Complication of anesthesia    Doesn't wake up very well   Nasal septal perforation    Ovarian cancer (HCC)    Seasonal allergies    Seizure (HCC)    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  Was on Phenobarbital at one time--perhaps moved her to depakote    Family History  Problem Relation Age of Onset   Hypertension Mother    Thyroid disease Mother    Diabetes Father    Hypertension Father    Heart disease Father    Cancer Maternal Aunt        thyroid   Cancer Maternal Uncle        thyroid   Hypertension Paternal Uncle    Cancer Maternal Grandfather        colon   Asthma Brother    Breast cancer Neg Hx    BRCA 1/2 Neg Hx     Past Surgical History:  Procedure Laterality Date   APPENDECTOMY     BREAST BIOPSY Left    benign   OOPHORECTOMY  Not clear which side--per patient removed subsequent to TAH for ovarian cancer   ORIF TIBIA PLATEAU Right 04/24/2023   Procedure: OPEN REDUCTION INTERNAL FIXATION (ORIF) RIGHT TIBIAL PLATEAU FRACTURE;  Surgeon: Tarry Kos, MD;  Location: [facility] OR;  Service: Orthopedics;  Laterality: Right;   right tonsillectomy     TOTAL ABDOMINAL HYSTERECTOMY     With unilateral oophorectomy   Social History   Occupational History   Occupation: unemployed  Tobacco Use   Smoking status: Never   Smokeless tobacco: Never  Vaping Use   Vaping status: Never Used  Substance and Sexual Activity   Alcohol use: Yes    Alcohol/week: 1.0 standard drink of alcohol    Types: 1 Shots of liquor per week    Comment: occasional   Drug use: Yes    Types: "Crack" cocaine    Comment: History of cocaine abuse   Sexual activity: Not Currently    Birth control/protection: None, Surgical

## 2023-08-08 ENCOUNTER — Telehealth: Payer: Self-pay | Admitting: Physical Therapy

## 2023-08-08 ENCOUNTER — Encounter: Payer: Medicare PPO | Admitting: Physical Therapy

## 2023-08-08 NOTE — Telephone Encounter (Signed)
 I called pt to follow up after she did not show for her 10:15 PT appointment today. I left a message stating that pt has no further PT appointments made and she wished to reschedule she would need another referral from her physician since her last PT visit was 06/13/23. Our clinic number [phone] was left for pt to contact us  with any questions or to schedule a f/u visit with her referring provider.   Delon Lunger, PT, MPT 08/08/23 11:01 AM

## 2023-09-12 ENCOUNTER — Other Ambulatory Visit: Payer: Self-pay | Admitting: Physician Assistant

## 2023-09-20 ENCOUNTER — Other Ambulatory Visit: Payer: Self-pay | Admitting: Physician Assistant

## 2023-09-21 NOTE — Telephone Encounter (Signed)
Need to refer to pain mgmt if still needing tramadol

## 2023-09-25 ENCOUNTER — Telehealth: Payer: Self-pay | Admitting: Orthopaedic Surgery

## 2023-09-25 NOTE — Telephone Encounter (Signed)
 Rx refill Tramadol HCL   [facility] [address]

## 2023-09-26 ENCOUNTER — Other Ambulatory Visit: Payer: Self-pay | Admitting: Physician Assistant

## 2023-09-26 NOTE — Telephone Encounter (Signed)
LMOM for patient to call and schedule follow up.

## 2023-09-26 NOTE — Telephone Encounter (Signed)
 Needs to come in for po appt first

## 2023-10-28 ENCOUNTER — Emergency Department (HOSPITAL_COMMUNITY)
Admission: EM | Admit: 2023-10-28 | Discharge: 2023-10-28 | Discharge status: Against Medical Advice | Attending: Emergency Medicine | Admitting: Emergency Medicine

## 2023-10-28 DIAGNOSIS — Z5329 Procedure and treatment not carried out because of patient's decision for other reasons: Secondary | ICD-10-CM | POA: Diagnosis not present

## 2023-10-28 DIAGNOSIS — F1410 Cocaine abuse, uncomplicated: Secondary | ICD-10-CM | POA: Insufficient documentation

## 2023-10-28 DIAGNOSIS — T7421XA Adult sexual abuse, confirmed, initial encounter: Secondary | ICD-10-CM | POA: Diagnosis not present

## 2023-10-28 DIAGNOSIS — J45909 Unspecified asthma, uncomplicated: Secondary | ICD-10-CM | POA: Insufficient documentation

## 2023-10-28 DIAGNOSIS — I10 Essential (primary) hypertension: Secondary | ICD-10-CM | POA: Insufficient documentation

## 2023-10-28 DIAGNOSIS — Z79899 Other long term (current) drug therapy: Secondary | ICD-10-CM | POA: Insufficient documentation

## 2023-10-28 DIAGNOSIS — Z7951 Long term (current) use of inhaled steroids: Secondary | ICD-10-CM | POA: Insufficient documentation

## 2023-10-28 DIAGNOSIS — Z7982 Long term (current) use of aspirin: Secondary | ICD-10-CM | POA: Diagnosis not present

## 2023-10-28 DIAGNOSIS — Y900 Blood alcohol level of less than 20 mg/100 ml: Secondary | ICD-10-CM | POA: Insufficient documentation

## 2023-10-28 LAB — RAPID URINE DRUG SCREEN, HOSP PERFORMED
Amphetamines: NOT DETECTED
Barbiturates: NOT DETECTED
Benzodiazepines: NOT DETECTED
Cocaine: POSITIVE — AB
Opiates: NOT DETECTED
Tetrahydrocannabinol: NOT DETECTED

## 2023-10-28 LAB — COMPREHENSIVE METABOLIC PANEL WITH GFR
ALT: 15 U/L (ref 0–44)
AST: 19 U/L (ref 15–41)
Albumin: 3.7 g/dL (ref 3.5–5.0)
Alkaline Phosphatase: 60 U/L (ref 38–126)
Anion gap: 8 (ref 5–15)
BUN: 19 mg/dL (ref 8–23)
CO2: 25 mmol/L (ref 22–32)
Calcium: 8.8 mg/dL — ABNORMAL LOW (ref 8.9–10.3)
Chloride: 102 mmol/L (ref 98–111)
Creatinine, Ser: 1.01 mg/dL — ABNORMAL HIGH (ref 0.44–1.00)
GFR, Estimated: >60 mL/min (ref >60)
Glucose, Bld: 89 mg/dL (ref 70–99)
Potassium: 3.5 mmol/L (ref 3.5–5.1)
Sodium: 135 mmol/L (ref 135–145)
Total Bilirubin: 0.5 mg/dL (ref 0.0–1.2)
Total Protein: 6.3 g/dL — ABNORMAL LOW (ref 6.5–8.1)

## 2023-10-28 LAB — CBC WITH DIFFERENTIAL/PLATELET
Abs Immature Granulocytes: 0.02 10*3/uL (ref 0.00–0.07)
Basophils Absolute: 0.1 10*3/uL (ref 0.0–0.1)
Basophils Relative: 1 %
Eosinophils Absolute: 0.9 10*3/uL — ABNORMAL HIGH (ref 0.0–0.5)
Eosinophils Relative: 11 %
HCT: 37.8 % (ref 36.0–46.0)
Hemoglobin: 12.2 g/dL (ref 12.0–15.0)
Immature Granulocytes: 0 %
Lymphocytes Relative: 32 %
Lymphs Abs: 2.7 10*3/uL (ref 0.7–4.0)
MCH: 28.9 pg (ref 26.0–34.0)
MCHC: 32.3 g/dL (ref 30.0–36.0)
MCV: 89.6 fL (ref 80.0–100.0)
Monocytes Absolute: 0.8 10*3/uL (ref 0.1–1.0)
Monocytes Relative: 10 %
Neutro Abs: 3.8 10*3/uL (ref 1.7–7.7)
Neutrophils Relative %: 46 %
Platelets: 450 10*3/uL — ABNORMAL HIGH (ref 150–400)
RBC: 4.22 MIL/uL (ref 3.87–5.11)
RDW: 12 % (ref 11.5–15.5)
WBC: 8.3 10*3/uL (ref 4.0–10.5)
nRBC: 0 % (ref 0.0–0.2)

## 2023-10-28 LAB — ETHANOL: Alcohol, Ethyl (B): <10 mg/dL (ref <10)
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
## 2023-10-28 NOTE — ED Notes (Signed)
 [employer] is interviewing pt
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
## 2024-01-25 ENCOUNTER — Ambulatory Visit: Payer: Self-pay | Admitting: *Deleted

## 2024-01-25 NOTE — Telephone Encounter (Signed)
 Copied from CRM [id number]. Topic: Clinical - Red Word Triage >> Jan 25, 2024  9:56 AM Merlynn A wrote: Red Word that prompted transfer to Nurse Triage: Shortness of Breat,  Asthma flare up Reason for Disposition  [1] Longstanding difficulty breathing (e.g., CHF, COPD, emphysema) AND [2] WORSE than normal  Answer Assessment - Initial Assessment Questions 1. RESPIRATORY STATUS: Describe your breathing? (e.g., wheezing, shortness of breath, unable to speak, severe coughing)      I'm having shortness of breath.    I'm staying in a house where both people smoke.   The walls and everything is yellow with nicotine.   I have asthma.   It's really flaring up bad.    The towels are even saturated with nicotine.   I had to scrub the bathroom mirror to use it.   It's so coated in yellow nicotine.  I've moved in with a friend to see if getting out of the nicotine filled house would help me breathing better.    The high humidity does not help.   I'm in [place], [place].   My clothes even stink.      I live with a girlfriend.   I've moved to see if it would help my breathing by moving out of the house where they are smoking so heavily.   So I'm out of the house now.     2. ONSET: When did this breathing problem begin?      It started a month ago.     I moved Monday to get out of the house that is filled with nicotine. (Pt is talking very fast and jumping from one subject to another). 3. PATTERN Does the difficult breathing come and go, or has it been constant since it started?      It's constant.   4. SEVERITY: How bad is your breathing? (e.g., mild, moderate, severe)    - MILD: No SOB at rest, mild SOB with walking, speaks normally in sentences, can lie down, no retractions, pulse < 100.    - MODERATE: SOB at rest, SOB with minimal exertion and prefers to sit, cannot lie down flat, speaks in phrases, mild retractions, audible wheezing, pulse 100-120.    - SEVERE: Very SOB at rest, speaks in single words,  struggling to breathe, sitting hunched forward, retractions, pulse > 120      Moderate short of breath 5. RECURRENT SYMPTOM: Have you had difficulty breathing before? If Yes, ask: When was the last time? and What happened that time?      Yes   My asthma 6. CARDIAC HISTORY: Do you have any history of heart disease? (e.g., heart attack, angina, bypass surgery, angioplasty)      Not asked 7. LUNG HISTORY: Do you have any history of lung disease?  (e.g., pulmonary embolus, asthma, emphysema)     Asthma 8. CAUSE: What do you think is causing the breathing problem?      Being exposed to all the smoke and nicotine in the former house I was living in. 9. OTHER SYMPTOMS: Do you have any other symptoms? (e.g., dizziness, runny nose, cough, chest pain, fever)     Dizziness 10. O2 SATURATION MONITOR:  Do you use an oxygen saturation monitor (pulse oximeter) at home? If Yes, ask: What is your reading (oxygen level) today? What is your usual oxygen saturation reading? (e.g., 95%)       N/A 11. PREGNANCY: Is there any chance you are pregnant? When was your last menstrual  period?       Not asked 12. TRAVEL: Have you traveled out of the country in the last month? (e.g., travel history, exposures)       N/A  Protocols used: Breathing Difficulty-A-AH FYI Only or Action Required?: FYI only for provider.  Patient was last seen in primary care on 05/24/2023 by Kenn Pagan, DO. Called Nurse Triage reporting Shortness of Breath. Symptoms began a week ago. Interventions attempted: Nothing. Symptoms are: gradually worsening Asthma flare up .  Triage Disposition: See Within 3 Days in Office  Patient/caregiver understands and will follow disposition?: Yes I was unable to get her scheduled due to system issue so warm transferred her to [facility] in the Internal Medicine Clinic for scheduling.

## 2024-02-06 ENCOUNTER — Ambulatory Visit: Admitting: Student

## 2024-02-06 ENCOUNTER — Encounter: Payer: Self-pay | Admitting: Student

## 2024-02-06 VITALS — BP 134/73 | HR 91 | Temp 97.6°F | Ht 61.0 in | Wt 112.2 lb

## 2024-02-06 DIAGNOSIS — F319 Bipolar disorder, unspecified: Secondary | ICD-10-CM

## 2024-02-06 DIAGNOSIS — I10 Essential (primary) hypertension: Secondary | ICD-10-CM

## 2024-02-06 DIAGNOSIS — J4520 Mild intermittent asthma, uncomplicated: Secondary | ICD-10-CM

## 2024-02-06 DIAGNOSIS — J45909 Unspecified asthma, uncomplicated: Secondary | ICD-10-CM

## 2024-02-06 DIAGNOSIS — Z1211 Encounter for screening for malignant neoplasm of colon: Secondary | ICD-10-CM | POA: Insufficient documentation

## 2024-02-06 MED ORDER — ALBUTEROL SULFATE HFA 108 (90 BASE) MCG/ACT IN AERS: 1.0000 | INHALATION_SPRAY | Freq: Four times a day (QID) | RESPIRATORY_TRACT | 3 refills | Status: AC | PRN

## 2024-02-06 MED ORDER — AMLODIPINE BESYLATE 10 MG PO TABS: 10.0000 mg | ORAL_TABLET | Freq: Every day | ORAL | 3 refills | Status: AC

## 2024-02-06 MED ORDER — FLUTICASONE PROPIONATE 50 MCG/ACT NA SUSP: 1.0000 | Freq: Every day | NASAL | 2 refills | Status: AC

## 2024-02-06 NOTE — Progress Notes (Signed)
 Internal Medicine Clinic Attending  Case discussed with the resident at the time of the visit.  We reviewed the resident's history and exam and pertinent patient test results.  I agree with the assessment, diagnosis, and plan of care documented in the resident's note.

## 2024-02-06 NOTE — Assessment & Plan Note (Signed)
 Vitals:   02/06/24 1410 02/06/24 1414  BP: (!) 156/65 134/73   Initial BP elevated at 156/65, subsequent at 134/73. She does check her BP at [facility] and states it's around the 150/80 range consistently. Currently on amlodipine  5mg , will increase to 10mg .   Plan:  - Increase Amlodipine  to 10mg 

## 2024-02-06 NOTE — Assessment & Plan Note (Signed)
Order for screening colonoscopy placed.

## 2024-02-06 NOTE — Assessment & Plan Note (Signed)
 Diagnosed in early 2000s, states that she has had formal PFTs previously however I am unable to see them. She uses albuterol  inhaler PRN, and feels like she's been needing it more for the last few weeks due to the summer and her allergies flare up around this time. I have refilled her albuterol  inhaler, and placed an order for formal PFTs.  Plan: - Refill albuterol  inhaler  - PFT

## 2024-02-06 NOTE — Assessment & Plan Note (Signed)
 Not currently medicated, PHQ-9 at three. Not interested in seeing a psychiatrist at this time, but is interested in counseling. Placed a referral for [facility] case management social worker.

## 2024-02-06 NOTE — Progress Notes (Signed)
 CC: HTN/general routine follow up  HPI:  [name] is a 68 y.o. female living with a history stated below and presents today for hypertension. Please see problem based assessment and plan for additional details.  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  Was on Phenobarbital at one time--perhaps moved her to depakote    Current Outpatient Medications on File Prior to Visit  Medication Sig Dispense Refill   aspirin  EC 81 MG tablet Take 1 tablet (81 mg total) by mouth 2 (two) times daily. To be taken after surgery to prevent blood clots 84 tablet 0   Cholecalciferol (VITAMIN D-3 PO) Take 1 tablet by mouth daily.     Cyanocobalamin (VITAMIN B-12 PO) Take 1 tablet by mouth daily.     glycerin  adult 2 g suppository Place 1 suppository rectally as needed for constipation. 12 suppository 0   loratadine (CLARITIN) 10 MG tablet Take 10 mg by mouth daily.     methocarbamol  (ROBAXIN -750) 750 MG tablet Take 1 tablet (750 mg total) by mouth 2 (two) times daily as needed for muscle spasms. 20 tablet 2   Multiple Vitamin (MULTIVITAMIN WITH MINERALS) TABS tablet Take 1 tablet by mouth daily.     mupirocin  ointment (BACTROBAN ) 2 % Apply 1 Application topically 2 (two) times daily. Apply twice daily and cover with band aid until area has healed 22 g 0   ondansetron  (ZOFRAN ) 4 MG tablet Take 1 tablet (4 mg total) by mouth every 8 (eight) hours as needed for nausea or vomiting. 40 tablet 0   traMADol  (ULTRAM ) 50 MG tablet Take 1-2 tablets (50-100 mg total) by mouth daily as needed. 10 tablet 0   traMADol  (ULTRAM ) 50 MG tablet Take 1 tablet (50 mg total) by mouth every 12 (twelve) hours as needed. 30 tablet 1   [DISCONTINUED] cetirizine  (ZYRTEC  ALLERGY)  10 MG tablet Take 1 tablet (10 mg total) by mouth daily. (Patient not taking: Reported on 10/06/2019) 30 tablet 0   No current facility-administered medications on file prior to visit.    Family History  Problem Relation Age of Onset   Hypertension Mother    Thyroid disease Mother    Diabetes Father    Hypertension Father    Heart disease Father    Cancer Maternal Aunt        thyroid   Cancer Maternal Uncle        thyroid   Hypertension Paternal Uncle    Cancer Maternal Grandfather        colon   Asthma Brother    Breast cancer Neg Hx    BRCA 1/2 Neg Hx     Social History   Socioeconomic History   Marital status: Divorced    Spouse name: Not on file   Number of children: 2   Years of education: college   Highest education level: Not on file  Occupational History   Occupation: unemployed  Tobacco Use   Smoking status: Never   Smokeless tobacco: Never  Vaping Use   Vaping status: Never Used  Substance and Sexual Activity   Alcohol use: Yes    Alcohol/week: 1.0 standard drink of alcohol  Types: 1 Shots of liquor per week    Comment: occasional   Drug use: Yes    Types: Crack cocaine    Comment: History of cocaine abuse   Sexual activity: Not Currently    Birth control/protection: None, Surgical  Other Topics Concern   Not on file  Social History Narrative   Lives with current husband, Vinie Rolls.   He is employed   Social Drivers of Corporate investment banker Strain: Not on file  Food Insecurity: No Food Insecurity (04/24/2023)   Hunger Vital Sign    Worried About Running Out of Food in the Last Year: Never true    Ran Out of Food in the Last Year: Never true  Transportation Needs: No Transportation Needs (04/24/2023)   PRAPARE - Administrator, Civil Service (Medical): No    Lack of Transportation (Non-Medical): No  Physical Activity: Not on file  Stress: Not on file  Social Connections: Not on file  Intimate Partner Violence: Not At  Risk (04/24/2023)   Humiliation, Afraid, Rape, and Kick questionnaire    Fear of Current or Ex-Partner: No    Emotionally Abused: No    Physically Abused: No    Sexually Abused: No    Review of Systems: ROS negative except for what is noted on the assessment and plan.  Vitals:   02/06/24 1410 02/06/24 1414  BP: (!) 156/65 134/73  Pulse: 80 91  Temp: 97.6 F (36.4 C)   TempSrc: Oral   SpO2: 100%   Weight: 112 lb 3.2 oz (50.9 kg)   Height: 5' 1 (1.549 m)     Physical Exam: Constitutional: well-appearing female  in no acute distress Cardiovascular: regular rate and rhythm, no m/r/g Pulmonary/Chest: normal work of breathing on room air, lungs clear to auscultation bilaterally Abdominal: soft, non-tender, non-distended  Assessment & Plan:   Asthma Diagnosed in early 2000s, states that she has had formal PFTs previously however I am unable to see them. She uses albuterol  inhaler PRN, and feels like she's been needing it more for the last few weeks due to the summer and her allergies flare up around this time. I have refilled her albuterol  inhaler, and placed an order for formal PFTs.  Plan: - Refill albuterol  inhaler  - PFT  Hypertension Vitals:   02/06/24 1410 02/06/24 1414  BP: (!) 156/65 134/73   Initial BP elevated at 156/65, subsequent at 134/73. She does check her BP at [facility] and states it's around the 150/80 range consistently. Currently on amlodipine  5mg , will increase to 10mg .   Plan:  - Increase Amlodipine  to 10mg   Screening for colon cancer Order for screening colonoscopy placed  Bipolar disorder [facility]) Not currently medicated, PHQ-9 at three. Not interested in seeing a psychiatrist at this time, but is interested in counseling. Placed a referral for [facility] case management social worker.   Patient discussed with Dr. Machen  Areen Trautner, M.D. [facility] Internal Medicine, PGY-3 Pager: [phone] Date 02/06/2024 Time 3:19 PM

## 2024-02-06 NOTE — Patient Instructions (Addendum)
 Thank you so much for coming to the clinic today!   I have resent in the albuterol  inhaler, have also placed the order for formal evaluation of your lungs.  I have resent in the flonase  I have put in a referral for the colonoscopy  If you have any questions please feel free to the call the clinic at anytime at [phone]. It was a pleasure seeing you!  Best, Dr. Shloimy Michalski

## 2024-02-27 ENCOUNTER — Telehealth: Payer: Self-pay | Admitting: *Deleted

## 2024-02-27 NOTE — Progress Notes (Unsigned)
 Complex Care Management Note Care Guide Note  02/27/2024 Name: Mary Mccormick MRN: [id number] DOB: May 12, 1956   Complex Care Management Outreach Attempts: An unsuccessful telephone outreach was attempted today to offer the patient information about available complex care management services.  Follow Up Plan:  Additional outreach attempts will be made to offer the patient complex care management information and services.   Encounter Outcome:  No Answer  Harlene Satterfield  [facility]  [facility] Guide  Direct Dial: [phone]  Fax [phone]

## 2024-02-28 NOTE — Progress Notes (Unsigned)
 Complex Care Management Note Care Guide Note  02/28/2024 Name: Mary Mccormick MRN: [id number] DOB: [birth year]   Complex Care Management Outreach Attempts: A second unsuccessful outreach was attempted today to offer the patient with information about available complex care management services.  Follow Up Plan:  Additional outreach attempts will be made to offer the patient complex care management information and services.   Encounter Outcome:  No Answer  Harlene Satterfield  [facility]  [facility], [facility] Guide  Direct Dial: [phone]  Fax [phone]

## 2024-02-29 NOTE — Progress Notes (Signed)
 Complex Care Management Note Care Guide Note  02/29/2024 Name: SANJANA FOLZ MRN: [id number] DOB: 06/24/1956   Complex Care Management Outreach Attempts: A third unsuccessful outreach was attempted today to offer the patient with information about available complex care management services.  Follow Up Plan:  No further outreach attempts will be made at this time. We have been unable to contact the patient to offer or enroll patient in complex care management services.  Encounter Outcome:  No Answer  Harlene Satterfield  [facility]  [facility], [facility] Guide  Direct Dial: [phone]  Fax [phone]

## 2024-03-14 ENCOUNTER — Telehealth: Payer: Self-pay | Admitting: *Deleted

## 2024-03-14 DIAGNOSIS — F319 Bipolar disorder, unspecified: Secondary | ICD-10-CM

## 2024-03-14 NOTE — Progress Notes (Signed)
 Complex Care Management Note  Care Guide Note 03/14/2024 Name: Mary Mccormick MRN: [id number] DOB: 13-Apr-1956  Mary Mccormick is a 68 y.o. year old female who sees Nooruddin, Saad, MD for primary care. I reached out to Mary Mccormick by phone today to offer complex care management services.  [name] was given information about Complex Care Management services today including:   The Complex Care Management services include support from the care team which includes your Nurse Care Manager, Clinical Social Worker, or Pharmacist.  The Complex Care Management team is here to help remove barriers to the health concerns and goals most important to you. Complex Care Management services are voluntary, and the patient may decline or stop services at any time by request to their care team member.   Complex Care Management Consent Status: Patient did not agree to participate in complex care management services at this time.  Follow up plan:  None  Encounter Outcome:  Patient Refused  Harlene Satterfield  [facility]  [facility], [facility] Guide  Direct Dial: [phone]  Fax [phone]

## 2024-03-19 ENCOUNTER — Encounter

## 2024-03-25 ENCOUNTER — Ambulatory Visit (INDEPENDENT_AMBULATORY_CARE_PROVIDER_SITE_OTHER): Admitting: Student

## 2024-03-25 ENCOUNTER — Ambulatory Visit: Attending: Internal Medicine

## 2024-03-25 ENCOUNTER — Ambulatory Visit (HOSPITAL_COMMUNITY)
Admission: RE | Admit: 2024-03-25 | Discharge: 2024-03-25 | Disposition: A | Discharge status: Home / Self Care | Source: Ambulatory Visit | Attending: Family Medicine | Admitting: Family Medicine

## 2024-03-25 VITALS — BP 122/61 | HR 70 | Temp 97.6°F | Ht 61.0 in | Wt 119.8 lb

## 2024-03-25 DIAGNOSIS — Z1159 Encounter for screening for other viral diseases: Secondary | ICD-10-CM | POA: Diagnosis not present

## 2024-03-25 DIAGNOSIS — R079 Chest pain, unspecified: Secondary | ICD-10-CM | POA: Diagnosis not present

## 2024-03-25 DIAGNOSIS — R002 Palpitations: Secondary | ICD-10-CM

## 2024-03-25 NOTE — Progress Notes (Unsigned)
 EP to read.

## 2024-03-25 NOTE — Patient Instructions (Signed)
 Thank you, Mary Mccormick for allowing us  to provide your care today. Today we discussed chest pain/ fast hear rate.  I ordered a few labs to assess for causes of the palpitations (fast/pounding heart rate). I also ordered a monitor that will shipped to your house, you will wear this for one week!  I have ordered the following labs for you:   Lab Orders         Basic metabolic panel with GFR         Hepatitis C Ab reflex to Quant PCR         TSH        Follow up: 3 months    Remember: To seek care at the emergency department if the chest pain gets worse, is present at rest, or if you have another passing out episode.   Should you have any questions or concerns please call the internal medicine clinic at [phone].     Please note that our late policy has changed.  If you are more than 15 minutes late to your appointment, you may be asked to reschedule your appointment.  Dr. Kandis, D.O. [facility]

## 2024-03-25 NOTE — Assessment & Plan Note (Signed)
 Patient reports that in March or April, she started to have chest discomfort described as a pounding heart beat.  She does not experience this palpitation sensation daily, and was unable to tell me how many times per week or month she experiences this.  At first, she was not sure if it was anxiety versus humidity causing her palpitations, but with further discussion she was able to identify that her palpitations are linked more with exercise.  During some of these episodes, she does have shortness of breath and has had a few syncopal episodes with the last 1 being a couple weeks ago after she was walking through the woods.  She was not evaluated for any palpitations or syncope yet.  She denies palpitations or chest pain at rest, pleuritic chest pain, history of arrhythmias, she has never seen a cardiologist, the syncopal episode that she had was not a seizure, and with her history of cocaine she does not sense a pattern with palpitations and cocaine use.  Physical exam revealed regular rate and rhythm, negative orthostatic changes, EKG showed normal sinus rhythm without dropped beats.  With patient's history of cocaine use, suspect that cocaine use may be the source of palpitations; however, will evaluate for additional etiologies. Plan: - TSH ordered - BMP ordered - Zio monitor ordered for 1 week

## 2024-03-25 NOTE — Assessment & Plan Note (Signed)
 Hepatitis-C screening ordered.

## 2024-03-25 NOTE — Progress Notes (Signed)
 Internal Medicine Clinic Attending  Case discussed with the resident at the time of the visit.  We reviewed the resident's history and exam and pertinent patient test results.  I agree with the assessment, diagnosis, and plan of care documented in the resident's note.    I agree that cocaine use may be the cause of these palpitations, and we recommend cessation.   We will also rule out electrolyte abnormalities, thyroid disease, and arrhythmia with BMP, TSH, and Ziopatch.

## 2024-03-25 NOTE — Progress Notes (Signed)
 Established Patient Office Visit  Subjective   Patient ID: Mary Mccormick, female    DOB: 1955/10/02  Age: 68 y.o. MRN: [id number]  Chief Complaint  Patient presents with   Follow-up   Mary Mccormick is a 68 y.o. who presents to the clinic for an acute visit for chest pain.  Patient reports that in March or April, she started to have chest discomfort described as a pounding heart beat.  She does not experience this palpitation sensation daily, and was unable to tell me how many times per week or month she experiences this.  At first, she was not sure if it was anxiety versus humidity causing her palpitations, but with further discussion she was able to identify that her palpitations are linked more with exercise.  During some of these episodes, she does have shortness of breath and has had a few syncopal episodes with the last 1 being a couple weeks ago after she was walking through the woods.  She was not evaluated for any palpitations or syncope yet.  She denies palpitations or chest pain at rest, pleuritic chest pain, history of arrhythmias, she has never seen a cardiologist, the syncopal episode that she had was not a seizure, and with her history of cocaine she does not sense a pattern with palpitations and cocaine use.   Patient Active Problem List   Diagnosis Date Noted   Heart palpitations 03/25/2024   Need for hepatitis C screening test 03/25/2024   Screening for colon cancer 02/06/2024   Hypertension 05/25/2023   Breast cancer screening 05/25/2023   History of open reduction and internal fixation (ORIF) procedure 04/24/2023   Closed bicondylar fracture of right tibial plateau 04/19/2023   History of cocaine abuse (HCC) 05/10/2016   Bipolar disorder (HCC) 05/10/2016   Asthma    Seasonal allergies    Seizure (HCC)       Objective:     BP 122/61 (BP Location: Right Arm, Patient Position: Sitting, Cuff Size: Small)   Pulse 70   Temp 97.6 F (36.4 C) (Oral)   Ht 5' 1  (1.549 m)   Wt 119 lb 12.8 oz (54.3 kg)   SpO2 98%   BMI 22.64 kg/m  BP Readings from Last 3 Encounters:  03/25/24 122/61  02/06/24 134/73  10/28/23 (!) 146/85   Wt Readings from Last 3 Encounters:  03/25/24 119 lb 12.8 oz (54.3 kg)  02/06/24 112 lb 3.2 oz (50.9 kg)  10/28/23 110 lb (49.9 kg)   Orthostatic Vital signs: Lying:126/63, HR 64 Sitting:129/67, HR 67 Standing at 0 minutes:129/68, HR 65  Physical Exam Vitals reviewed.  Constitutional:      General: She is awake. She is not in acute distress.    Appearance: She is not toxic-appearing.  Cardiovascular:     Rate and Rhythm: Normal rate and regular rhythm.     Pulses: Normal pulses.          Carotid pulses are 2+ on the right side and 2+ on the left side.      Radial pulses are 2+ on the right side and 2+ on the left side.       Dorsalis pedis pulses are 2+ on the right side and 2+ on the left side.     Heart sounds: Normal heart sounds, S1 normal and S2 normal. No murmur heard. Pulmonary:     Effort: Pulmonary effort is normal.  Musculoskeletal:     Right lower leg: No edema.  Left lower leg: No edema.  Skin:    General: Skin is warm and dry.  Neurological:     Mental Status: She is alert.  Psychiatric:        Attention and Perception: Attention normal.        Mood and Affect: Mood is anxious.      No results found for any visits on 03/25/24.  Last metabolic panel Lab Results  Component Value Date   GLUCOSE 89 10/28/2023   NA 135 10/28/2023   K 3.5 10/28/2023   CL 102 10/28/2023   CO2 25 10/28/2023   BUN 19 10/28/2023   CREATININE 1.01 (H) 10/28/2023   GFRNONAA >60 10/28/2023   CALCIUM 8.8 (L) 10/28/2023   PROT 6.3 (L) 10/28/2023   ALBUMIN 3.7 10/28/2023   BILITOT 0.5 10/28/2023   ALKPHOS 60 10/28/2023   AST 19 10/28/2023   ALT 15 10/28/2023   ANIONGAP 8 10/28/2023     The ASCVD Risk score (Arnett DK, et al., 2019) failed to calculate for the following reasons:   Cannot find a previous  HDL lab   Cannot find a previous total cholesterol lab    Assessment & Plan:   Problem List Items Addressed This Visit       Other   Heart palpitations   Patient reports that in March or April, she started to have chest discomfort described as a pounding heart beat.  She does not experience this palpitation sensation daily, and was unable to tell me how many times per week or month she experiences this.  At first, she was not sure if it was anxiety versus humidity causing her palpitations, but with further discussion she was able to identify that her palpitations are linked more with exercise.  During some of these episodes, she does have shortness of breath and has had a few syncopal episodes with the last 1 being a couple weeks ago after she was walking through the woods.  She was not evaluated for any palpitations or syncope yet.  She denies palpitations or chest pain at rest, pleuritic chest pain, history of arrhythmias, she has never seen a cardiologist, the syncopal episode that she had was not a seizure, and with her history of cocaine she does not sense a pattern with palpitations and cocaine use.  Physical exam revealed regular rate and rhythm, negative orthostatic changes, EKG showed normal sinus rhythm without dropped beats.  With patient's history of cocaine use, suspect that cocaine use may be the source of palpitations; however, will evaluate for additional etiologies. Plan: - TSH ordered - BMP ordered - Zio monitor ordered for 1 week      Need for hepatitis C screening test   Hepatitis C screening ordered.      Relevant Orders   Hepatitis C Ab reflex to Quant PCR   Other Visit Diagnoses       Chest pain, unspecified type    -  Primary   Relevant Orders   EKG 12-Lead (Completed)   Basic metabolic panel with GFR   LONG TERM MONITOR XT (3-14 DAYS)   TSH     Palpitations       Relevant Orders   TSH       Return in about 3 months (around 06/25/2024) for Chronic  conditions .    Mary Lease, DO

## 2024-03-26 ENCOUNTER — Ambulatory Visit: Payer: Self-pay | Admitting: Student

## 2024-03-26 LAB — BASIC METABOLIC PANEL WITH GFR
BUN/Creatinine Ratio: 26 (ref 12–28)
BUN: 24 mg/dL (ref 8–27)
CO2: 23 mmol/L (ref 20–29)
Calcium: 9.4 mg/dL (ref 8.7–10.3)
Chloride: 106 mmol/L (ref 96–106)
Creatinine, Ser: 0.94 mg/dL (ref 0.57–1.00)
Glucose: 48 mg/dL — ABNORMAL LOW (ref 70–99)
Potassium: 4.4 mmol/L (ref 3.5–5.2)
Sodium: 144 mmol/L (ref 134–144)
eGFR: 67 mL/min/1.73 (ref >59)

## 2024-03-26 LAB — TSH: TSH: 1.2 u[IU]/mL (ref 0.450–4.500)

## 2024-03-26 LAB — HCV INTERPRETATION

## 2024-03-26 LAB — HCV AB W REFLEX TO QUANT PCR: HCV Ab: NONREACTIVE

## 2024-03-27 ENCOUNTER — Encounter: Admitting: Student

## 2024-03-28 ENCOUNTER — Telehealth: Payer: Self-pay | Admitting: *Deleted

## 2024-03-28 NOTE — Telephone Encounter (Signed)
 Patiemt was called and given lab results per Dr. Kandis this afternoon.   Copied from CRM #[id number]. Topic: Clinical - Lab/Test Results >> Mar 28, 2024  2:36 PM Miquel SAILOR wrote: Reason for CRM: Patient returning call for Lab results from 08/25. Called office and transferred

## 2024-04-12 ENCOUNTER — Telehealth: Payer: Self-pay | Admitting: *Deleted

## 2024-04-12 NOTE — Telephone Encounter (Signed)
 Copied from CRM [id number]. Topic: Clinical - Lab/Test Results >> Apr 12, 2024 11:28 AM Mercer PEDLAR wrote: Reason for CRM: Patient is calling regarding results for heart monitor which she wore wore a week .

## 2024-04-15 DIAGNOSIS — R079 Chest pain, unspecified: Secondary | ICD-10-CM | POA: Diagnosis not present

## 2024-04-15 NOTE — Telephone Encounter (Signed)
 Name: Mary Mccormick, [place] MRN: [id number]  Date: 05/20/2024 Status: Sch  Time: 10:45 AM Length: 30  Visit Type: PHYSICAL [8004] Copay: $0.00  Provider: Nooruddin, Saad, MD      Copied from CRM [id number]. Topic: Appointments - Appointment Scheduling >> Apr 12, 2024 11:29 AM Vivian Z wrote: Patient would like a call to schedule an appointment for a physical.

## 2024-04-16 DIAGNOSIS — R079 Chest pain, unspecified: Secondary | ICD-10-CM

## 2024-04-17 ENCOUNTER — Telehealth: Payer: Self-pay | Admitting: Student

## 2024-04-17 NOTE — Telephone Encounter (Signed)
 Copied from CRM #[id number]. Topic: Appointments - Scheduling Inquiry for Clinic >> Apr 15, 2024 10:27 AM Miquel SAILOR wrote: Reason for CRM: Patient requesting AWV ASAP no availble in PCP and group. [phone]

## 2024-04-17 NOTE — Telephone Encounter (Signed)
 I called patient. Left message for patient to call back and schedule AWV.

## 2024-04-24 NOTE — Telephone Encounter (Signed)
 Mary Mccormick left a message w/answering service on 07/02/2020 that she needed an antibiotic and to call it into [facility] on [address]. I have tried to call her several times with no answer and no voice mail box set up. I can not address this without speaking to her to find out what is going on to be able to address with Dr. Ethyl.

## 2024-05-20 ENCOUNTER — Ambulatory Visit: Admitting: Student

## 2024-05-20 VITALS — BP 132/62 | HR 76 | Temp 97.7°F | Ht 61.0 in | Wt 123.4 lb

## 2024-05-20 DIAGNOSIS — Z8249 Family history of ischemic heart disease and other diseases of the circulatory system: Secondary | ICD-10-CM | POA: Diagnosis not present

## 2024-05-20 DIAGNOSIS — J45909 Unspecified asthma, uncomplicated: Secondary | ICD-10-CM

## 2024-05-20 DIAGNOSIS — Z836 Family history of other diseases of the respiratory system: Secondary | ICD-10-CM

## 2024-05-20 DIAGNOSIS — R002 Palpitations: Secondary | ICD-10-CM

## 2024-05-20 DIAGNOSIS — Z79899 Other long term (current) drug therapy: Secondary | ICD-10-CM | POA: Diagnosis not present

## 2024-05-20 DIAGNOSIS — F1410 Cocaine abuse, uncomplicated: Secondary | ICD-10-CM | POA: Diagnosis not present

## 2024-05-20 DIAGNOSIS — J4520 Mild intermittent asthma, uncomplicated: Secondary | ICD-10-CM

## 2024-05-20 DIAGNOSIS — F1411 Cocaine abuse, in remission: Secondary | ICD-10-CM

## 2024-05-20 DIAGNOSIS — Z23 Encounter for immunization: Secondary | ICD-10-CM | POA: Diagnosis not present

## 2024-05-20 NOTE — Patient Instructions (Signed)
 Thank you so much for coming to the clinic today!   I'm so glad you're doing so well! I don't think we need to make any changes to anything! We will see you back in about 6 months just to touch base!  If you have any questions please feel free to the call the clinic at anytime at [phone]. It was a pleasure seeing you!  Best, Dr. Samyak Sackmann

## 2024-05-21 ENCOUNTER — Encounter: Payer: Self-pay | Admitting: Student

## 2024-05-21 DIAGNOSIS — Z23 Encounter for immunization: Secondary | ICD-10-CM | POA: Insufficient documentation
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
## 2024-05-21 NOTE — Assessment & Plan Note (Signed)
 Stable with albuterol inhaler

## 2024-05-21 NOTE — Progress Notes (Signed)
.  att

## 2024-05-21 NOTE — Progress Notes (Signed)
 Internal Medicine Clinic Attending  Case discussed with the resident at the time of the visit.  We reviewed the resident's history and exam and pertinent patient test results.  I agree with the assessment, diagnosis, and plan of care documented in the resident's note.

## 2024-05-21 NOTE — Assessment & Plan Note (Signed)
 Was brought up at her last visit on August 25, workup was negative with a TSH, electrolytes within normal limits, and Zio patch ordered did not show any significant arrhythmias.  Likely secondary to cocaine use, and she states that these have gone away since she has started to cut down on cocaine.

## 2024-05-21 NOTE — Assessment & Plan Note (Addendum)
 Flu vaccine given today, as well as pneumonia vaccine.

## 2024-05-21 NOTE — Assessment & Plan Note (Signed)
 Longstanding history of cocaine use, however she has been decreasing the amount that she has been using.  She has a new partner who has been helping her try to quit, she states that most of the thrill happens when she buys cocaine, and not actually using it.  Sometimes she will even forget that she even has it.  Compared to my last visit with her, patient seems a lot more determined to quit completely.  We discussed that her goal should be at the next time she comes to the internal medicine center to be completely off cocaine.

## 2024-05-30 ENCOUNTER — Ambulatory Visit: Admitting: Orthopaedic Surgery

## 2024-06-10 ENCOUNTER — Other Ambulatory Visit: Payer: Self-pay | Admitting: Student

## 2024-06-10 DIAGNOSIS — Z1231 Encounter for screening mammogram for malignant neoplasm of breast: Secondary | ICD-10-CM

## 2024-07-17 ENCOUNTER — Ambulatory Visit: Payer: Self-pay

## 2024-07-18 ENCOUNTER — Ambulatory Visit

## 2024-08-07 ENCOUNTER — Other Ambulatory Visit (INDEPENDENT_AMBULATORY_CARE_PROVIDER_SITE_OTHER): Payer: Self-pay

## 2024-08-07 ENCOUNTER — Ambulatory Visit: Admitting: Physician Assistant

## 2024-08-07 DIAGNOSIS — M25561 Pain in right knee: Secondary | ICD-10-CM

## 2024-08-07 DIAGNOSIS — G8929 Other chronic pain: Secondary | ICD-10-CM

## 2024-08-07 DIAGNOSIS — M25562 Pain in left knee: Secondary | ICD-10-CM

## 2024-08-07 MED ORDER — LIDOCAINE HCL 1 % IJ SOLN
2.0000 mL | INTRAMUSCULAR | Status: AC | PRN
  Administered 2024-08-07: 2 mL

## 2024-08-07 MED ORDER — METHYLPREDNISOLONE ACETATE 40 MG/ML IJ SUSP
40.0000 mg | INTRAMUSCULAR | Status: AC | PRN
  Administered 2024-08-07: 40 mg via INTRA_ARTICULAR

## 2024-08-07 MED ORDER — BUPIVACAINE HCL 0.25 % IJ SOLN
2.0000 mL | INTRAMUSCULAR | Status: AC | PRN
  Administered 2024-08-07: 2 mL via INTRA_ARTICULAR

## 2024-08-07 NOTE — Progress Notes (Signed)
 "  Office Visit Note   Patient: Mary Mccormick           Date of Birth: [birth year]           MRN: [id number] Visit Date: 08/07/2024              Requested by: Nooruddin, Saad, MD [address], [address] [place],  [place] [address] PCP: Nooruddin, Saad, MD   Assessment & Plan: Visit Diagnoses:  1. Chronic pain of right knee     Plan: Impression is right knee early posttraumatic osteoarthritis.  We have discussed proceeding with cortisone injection for which she would like to proceed.  She will follow-up with us  as needed.  Follow-Up Instructions: Return if symptoms worsen or fail to improve.   Orders:  Orders Placed This Encounter  Procedures   Large Joint Inj: R knee   XR KNEE 3 VIEW LEFT   XR KNEE 3 VIEW RIGHT   No orders of the defined types were placed in this encounter.     Procedures: Large Joint Inj: R knee on 08/07/2024 1:38 PM Indications: pain Details: 22 G needle, anterolateral approach Medications: 2 mL lidocaine  1 %; 2 mL bupivacaine  0.25 %; 40 mg methylPREDNISolone  acetate 40 MG/ML      Clinical Data: No additional findings.   Subjective: Chief Complaint  Patient presents with   Right Leg - Pain   Left Leg - Pain    HPI patient is a pleasant 69 year old female who comes in today with right knee pain.  Symptoms began a few months ago without any injury or change in activity.  Of note, she is status post ORIF right tibial plateau fracture 04/24/2023.  The pain she is having is throughout the entire knee and is described as a constant ache.  She has associated stiffness and swelling.  Pain is worse going from seated to standing position.  She has been taking NSAIDs without relief.  No previous cortisone injections right knee.  Review of Systems as detailed in HPI.  All others reviewed and are negative.   Objective: Vital Signs: There were no vitals taken for this visit.  Physical Exam well-developed well-nourished female in no acute distress.   Alert and oriented x 3.  Ortho Exam right knee exam: No effusion.  Range of motion 0 to 115 degrees.  Lateral joint line tenderness.  She is neurovascular intact distally.  Specialty Comments:  No specialty comments available.  Imaging: XR KNEE 3 VIEW LEFT Result Date: 08/07/2024 No acute or structural abnormalities  XR KNEE 3 VIEW RIGHT Result Date: 08/07/2024 X-rays demonstrate mild degenerative changes.  Stable hardware    PMFS History: Patient Active Problem List   Diagnosis Date Noted   Vaccine for streptococcus pneumoniae and influenza 05/21/2024   Heart palpitations 03/25/2024   Screening for colon cancer 02/06/2024   Hypertension 05/25/2023   History of cocaine abuse (HCC) 05/10/2016   Bipolar disorder (HCC) 05/10/2016   Asthma    Seasonal allergies    Past Medical History:  Diagnosis Date   Asthma    Bipolar disorder (HCC) 05/10/2016   Closed bicondylar fracture of right tibial plateau 04/19/2023   Cocaine abuse (HCC)    Complication of anesthesia    Doesn't wake up very well   History of open reduction and internal fixation (ORIF) procedure 04/24/2023   Nasal septal perforation    Ovarian cancer (HCC)    Seasonal allergies    Seizure (HCC)    No seizure  for 15 years.  Was on Phenobarbital at one time--perhaps moved her to depakote    Family History  Problem Relation Age of Onset   Hypertension Mother    Thyroid  disease Mother    Diabetes Father    Hypertension Father    Heart disease Father    Cancer Maternal Aunt        thyroid    Cancer Maternal Uncle        thyroid    Hypertension Paternal Uncle    Cancer Maternal Grandfather        colon   Asthma Brother    Breast cancer Neg Hx    BRCA 1/2 Neg Hx     Past Surgical History:  Procedure Laterality Date   APPENDECTOMY     BREAST BIOPSY Left    benign   OOPHORECTOMY     Not clear which side--per patient removed subsequent to TAH for ovarian cancer   ORIF TIBIA PLATEAU Right 04/24/2023    Procedure: OPEN REDUCTION INTERNAL FIXATION (ORIF) RIGHT TIBIAL PLATEAU FRACTURE;  Surgeon: Jerri Kay HERO, MD;  Location: [facility] OR;  Service: Orthopedics;  Laterality: Right;   right tonsillectomy     TOTAL ABDOMINAL HYSTERECTOMY     With unilateral oophorectomy   Social History   Occupational History   Occupation: unemployed  Tobacco Use   Smoking status: Never   Smokeless tobacco: Never  Vaping Use   Vaping status: Never Used  Substance and Sexual Activity   Alcohol use: Yes    Alcohol/week: 1.0 standard drink of alcohol    Types: 1 Shots of liquor per week    Comment: occasional   Drug use: Yes    Types: Crack cocaine    Comment: History of cocaine abuse   Sexual activity: Not Currently    Birth control/protection: None, Surgical        "
